# Patient Record
Sex: Female | Born: 1995 | Race: Black or African American | Hispanic: No | Marital: Single | Smoking: Never smoker
Health system: Southern US, Community
[De-identification: ages and names within clinical notes are randomized; demographics above are authoritative.]

## PROBLEM LIST (undated history)

## (undated) DIAGNOSIS — B9689 Other specified bacterial agents as the cause of diseases classified elsewhere: Secondary | ICD-10-CM

## (undated) DIAGNOSIS — N760 Acute vaginitis: Secondary | ICD-10-CM

## (undated) DIAGNOSIS — G43909 Migraine, unspecified, not intractable, without status migrainosus: Secondary | ICD-10-CM

## (undated) HISTORY — DX: Migraine, unspecified, not intractable, without status migrainosus: G43.909

## (undated) HISTORY — PX: NO PAST SURGERIES: SHX2092

## (undated) HISTORY — PX: NASAL SINUS SURGERY: SHX719

---
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
## 2016-08-18 DIAGNOSIS — N6320 Unspecified lump in the left breast, unspecified quadrant: Secondary | ICD-10-CM | POA: Insufficient documentation

## 2017-03-20 ENCOUNTER — Emergency Department
Admission: EM | Admit: 2017-03-20 | Discharge: 2017-03-20 | Disposition: A | Discharge status: Home / Self Care | Payer: Self-pay | Attending: Emergency Medicine | Admitting: Emergency Medicine

## 2017-03-20 ENCOUNTER — Other Ambulatory Visit: Payer: Self-pay

## 2017-03-20 DIAGNOSIS — R112 Nausea with vomiting, unspecified: Secondary | ICD-10-CM | POA: Insufficient documentation

## 2017-03-20 DIAGNOSIS — R1010 Upper abdominal pain, unspecified: Secondary | ICD-10-CM | POA: Insufficient documentation

## 2017-03-20 DIAGNOSIS — R197 Diarrhea, unspecified: Secondary | ICD-10-CM

## 2017-03-20 DIAGNOSIS — R1084 Generalized abdominal pain: Secondary | ICD-10-CM

## 2017-03-20 LAB — CBC
HEMATOCRIT: 40.3 % (ref 35.0–47.0)
Hemoglobin: 13.3 g/dL (ref 12.0–16.0)
MCH: 28.9 pg (ref 26.0–34.0)
MCHC: 33.1 g/dL (ref 32.0–36.0)
MCV: 87.3 fL (ref 80.0–100.0)
Platelets: 391 10*3/uL (ref 150–440)
RBC: 4.61 MIL/uL (ref 3.80–5.20)
RDW: 13.1 % (ref 11.5–14.5)
WBC: 7.9 10*3/uL (ref 3.6–11.0)

## 2017-03-20 LAB — URINALYSIS, COMPLETE (UACMP) WITH MICROSCOPIC
BACTERIA UA: NONE SEEN
BILIRUBIN URINE: NEGATIVE
Glucose, UA: NEGATIVE mg/dL
Hgb urine dipstick: NEGATIVE
Ketones, ur: NEGATIVE mg/dL
Leukocytes, UA: NEGATIVE
Nitrite: NEGATIVE
Protein, ur: NEGATIVE mg/dL
SPECIFIC GRAVITY, URINE: 1.023 (ref 1.005–1.030)
pH: 5 (ref 5.0–8.0)

## 2017-03-20 LAB — COMPREHENSIVE METABOLIC PANEL
ALBUMIN: 4.3 g/dL (ref 3.5–5.0)
ALT: 16 U/L (ref 14–54)
AST: 24 U/L (ref 15–41)
Alkaline Phosphatase: 44 U/L (ref 38–126)
Anion gap: 10 (ref 5–15)
BUN: 10 mg/dL (ref 6–20)
CO2: 24 mmol/L (ref 22–32)
CREATININE: 0.64 mg/dL (ref 0.44–1.00)
Calcium: 9.4 mg/dL (ref 8.9–10.3)
Chloride: 103 mmol/L (ref 101–111)
GFR calc Af Amer: >60 mL/min (ref >60)
GFR calc non Af Amer: >60 mL/min (ref >60)
GLUCOSE: 89 mg/dL (ref 65–99)
POTASSIUM: 3.5 mmol/L (ref 3.5–5.1)
Sodium: 137 mmol/L (ref 135–145)
Total Bilirubin: 0.6 mg/dL (ref 0.3–1.2)
Total Protein: 7.8 g/dL (ref 6.5–8.1)

## 2017-03-20 LAB — POCT PREGNANCY, URINE: PREG TEST UR: NEGATIVE

## 2017-03-20 LAB — LIPASE, BLOOD: LIPASE: 33 U/L (ref 11–51)

## 2017-03-20 MED ORDER — KETOROLAC TROMETHAMINE 60 MG/2ML IM SOLN
60.0000 mg | Freq: Once | INTRAMUSCULAR | Status: AC
  Administered 2017-03-20: 60 mg via INTRAMUSCULAR
  Filled 2017-03-20: qty 2

## 2017-03-20 MED ORDER — ONDANSETRON 4 MG PO TBDP: 4.0000 mg | ORAL_TABLET | Freq: Three times a day (TID) | ORAL | 0 refills | Status: DC | PRN

## 2017-03-20 MED ORDER — ONDANSETRON 4 MG PO TBDP
4.0000 mg | ORAL_TABLET | Freq: Once | ORAL | Status: AC
  Administered 2017-03-20: 4 mg via ORAL
  Filled 2017-03-20: qty 1

## 2017-03-20 NOTE — Discharge Instructions (Signed)
Please follow-up with the acute care clinic for further evaluation.  Please return with any other concerns or any worsening condition.

## 2017-03-20 NOTE — ED Provider Notes (Signed)
[facility] Emergency Department Provider Note   ____________________________________________   First MD Initiated Contact with Patient 03/20/17 [phone]     (approximate)  I have reviewed the triage vital signs and the nursing notes.   HISTORY  Chief Complaint Diarrhea and Emesis    HPI Vicki Hill is a 22 y.o. female who comes into the hospital today with some nausea vomiting and diarrhea.  The patient states that she did not go to work yesterday morning because she was feeling nauseous.  She developed some vomiting as well as some diarrhea.  She decided to come in last night because she vomited and had diarrhea all day long.  The patient states that she vomited about 4 times and had about 5 episodes of diarrhea.  The patient stated that she had some abdominal pain but it has come down it is not as bad as it was previously.  The patient states that she did have a sick contact she found out about yesterday.  The patient rates her pain a 5 out of 10 in intensity.  She reports that it is crampy pain.  The emesis was whenever she tried to eat and just watery.  The patient's diarrhea was brown.  She came into the hospital today for evaluation of her symptoms.  History reviewed. No pertinent past medical history.  There are no active problems to display for this patient.   History reviewed. No pertinent surgical history.  Prior to Admission medications   Medication Sig Start Date End Date Taking? Authorizing Provider  ondansetron (ZOFRAN ODT) 4 MG disintegrating tablet Take 1 tablet (4 mg total) by mouth every 8 (eight) hours as needed for nausea or vomiting. 03/20/17   Loney Hering, MD    Allergies Patient has no known allergies.  No family history on file.  Social History Social History   Tobacco Use  . Smoking status: Never Smoker  . Smokeless tobacco: Never Used  Substance Use Topics  . Alcohol use: No    Frequency: Never  . Drug use: No     Review of Systems  Constitutional: No fever/chills Eyes: No visual changes. ENT: No sore throat. Cardiovascular: Denies chest pain. Respiratory: Denies shortness of breath. Gastrointestinal:  abdominal pain.   nausea,  vomiting.   diarrhea.  No constipation. Genitourinary: Negative for dysuria. Musculoskeletal: Negative for back pain. Skin: Negative for rash. Neurological: Negative for headaches, focal weakness or numbness. No fevers  ____________________________________________   PHYSICAL EXAM:  VITAL SIGNS: ED Triage Vitals [03/20/17 0043]  Enc Vitals Group     BP 130/78     Pulse Rate 87     Resp 18     Temp 97.6 F (36.4 C)     Temp Source Oral     SpO2 100 %     Weight 185 lb (83.9 kg)     Height 5\' 4"  (1.626 m)     Head Circumference      Peak Flow      Pain Score 7     Pain Loc      Pain Edu?      Excl. in [place]?     Constitutional: Alert and oriented. Well appearing and in mild distress. Eyes: Conjunctivae are normal. PERRL. EOMI. Head: Atraumatic. Nose: No congestion/rhinnorhea. Mouth/Throat: Mucous membranes are moist.  Oropharynx non-erythematous. Cardiovascular: Normal rate, regular rhythm. Grossly normal heart sounds.  Good peripheral circulation. Respiratory: Normal respiratory effort.  No retractions. Lungs CTAB. Gastrointestinal: Soft with  some mild upper abdominal tenderness to palpation. No distention.  Positive bowel sounds Musculoskeletal: No lower extremity tenderness nor edema.   Neurologic:  Normal speech and language.  Skin:  Skin is warm, dry and intact.  Psychiatric: Mood and affect are normal.  Oriented well-appearing  ____________________________________________   LABS (all labs ordered are listed, but only abnormal results are displayed)  Labs Reviewed  URINALYSIS, COMPLETE (UACMP) WITH MICROSCOPIC - Abnormal; Notable for the following components:      Result Value   Color, Urine YELLOW (*)    APPearance CLEAR (*)     Squamous Epithelial / LPF 0-5 (*)    All other components within normal limits  LIPASE, BLOOD  COMPREHENSIVE METABOLIC PANEL  CBC  POC URINE PREG, ED  POCT PREGNANCY, URINE   ____________________________________________  EKG  None ____________________________________________  RADIOLOGY  ED MD interpretation: None  Official radiology report(s): No results found.  ____________________________________________   PROCEDURES  Procedure(s) performed: None  Procedures  Critical Care performed: No  ____________________________________________   INITIAL IMPRESSION / ASSESSMENT AND PLAN / ED COURSE  As part of my medical decision making, I reviewed the following data within the electronic MEDICAL RECORD NUMBER Notes from prior ED visits and [place] Controlled Substance Database   This is a 22 year old female who comes into the hospital today with nausea vomiting diarrhea and abdominal pain.  My differential diagnosis includes gastroenteritis, pancreatitis, appendicitis  I did check some blood work on the patient.  The patient had a CBC CMP and lipase which were done which was all negative.  The patient also had a urinalysis which was negative.  When I evaluated the patient she was calm and in no acute distress.  I did give her a shot of Toradol as well as Zofran and tried some oral hydration.  The patient states that she has not vomited since yesterday afternoon and her last episode of diarrhea was at 8 PM.  I do not feel that the patient needs any imaging studies at this time.  She has some very active bowel sounds at this time.  I will discharge the patient home with some Zofran for nausea and vomiting and to have her follow-up with her primary care physician.  The patient will be discharged home.      ____________________________________________   FINAL CLINICAL IMPRESSION(S) / ED DIAGNOSES  Final diagnoses:  Nausea vomiting and diarrhea  Generalized abdominal pain     ED  Discharge Orders        Ordered    ondansetron (ZOFRAN ODT) 4 MG disintegrating tablet  Every 8 hours PRN     03/20/17 0707       Note:  This document was prepared using Dragon voice recognition software and may include unintentional dictation errors.    Loney Hering, MD 03/20/17 [phone]

## 2017-03-20 NOTE — ED Triage Notes (Signed)
Pt arrives to ED via POV from home with c/o diarrhea, emesis, and abdominal cramping x2 days. Pt reports 4 episodes of emesis and "a lot" of diarrhea. Pt denies chest pain, no SHOB. Pt is A&O, in NAD; RR even, regular, and unlabored. Skin color/temp is WNL.

## 2017-03-20 NOTE — ED Notes (Signed)
Pt reports going to work yesterday and leaving and vomiting and diarrhea. Pt states she is able to tolerate fluid now but has diarrhea. Pt has hyperactive bowels per MD. No distress noted.

## 2017-03-20 NOTE — ED Notes (Signed)
Pt given meds and po fluid to tolerate. Pt asked how long she would be here. Pt states she needs to be gone by 0900. Told pt we would have her d/c by then. Pt asked that when the MD write her note it be for yesterday as well. Explained to pt that we are unable to back date work notes.

## 2017-06-30 ENCOUNTER — Ambulatory Visit
Admission: EM | Admit: 2017-06-30 | Discharge: 2017-06-30 | Disposition: A | Discharge status: Home / Self Care | Payer: Self-pay | Attending: Emergency Medicine | Admitting: Emergency Medicine

## 2017-06-30 ENCOUNTER — Other Ambulatory Visit: Payer: Self-pay

## 2017-06-30 DIAGNOSIS — N760 Acute vaginitis: Secondary | ICD-10-CM

## 2017-06-30 DIAGNOSIS — B9689 Other specified bacterial agents as the cause of diseases classified elsewhere: Secondary | ICD-10-CM

## 2017-06-30 LAB — WET PREP, GENITAL
Sperm: NONE SEEN
TRICH WET PREP: NONE SEEN
Yeast Wet Prep HPF POC: NONE SEEN

## 2017-06-30 MED ORDER — METRONIDAZOLE 500 MG PO TABS: 500.0000 mg | ORAL_TABLET | Freq: Two times a day (BID) | ORAL | 0 refills | Status: AC

## 2017-06-30 NOTE — ED Notes (Signed)
Pt given swab for self wet-prep.

## 2017-06-30 NOTE — ED Triage Notes (Signed)
Reports "possible yeast infection". Noticed on Monday for symptoms.  Has had one in past and feels similar.

## 2017-06-30 NOTE — Discharge Instructions (Addendum)
Here is a list of primary care providers who are taking new patients: ° °Dr. Deanna Jones, Dr. William Plonk °[address] °[address] °[place] [address] °[phone] ° °[facility] [place] °[address] [address]  °[place] [address]  °[phone] ° °[facility] °[address]  °[place], [place] [address] °[phone] ° °[facility] [place] °[address]  °[phone] °[place], [place] [address] ° °Here are clinics/ other resources who will see you if you do not have insurance. Some have certain criteria that you must meet. Call them and find out what they are: ° °[facility]: °[address] [place] [address] [place], [place] [address] °Phone: [phone] °Hours: First and Third Saturdays of each Month, 9 a.m. - 1 p.m. ° °[facility]: °[address], [place], [place] [address] °Phone: [phone] °Hours: °Tuesday, 4 p.m. - 8 p.m. °Thursday, 1 p.m. - 8 p.m. °Wednesday, 9 a.m. - Noon ° °[facility] °[address], [place], [place] [address] °Phone: [phone] °Pharmacy Phone Number: [phone] °Dental Phone Number: [phone] °ACA Insurance Help: [phone] ° °Dental Hours: °Monday - Thursday, 8 a.m. - 6 p.m. ° °[facility] °[address] [place], [place] [address] °Phone: [phone] °Pharmacy Phone Number: [phone] °ACA Insurance Help: [phone] ° °[facility] °[address] [place], [place] [address] °Phone: [phone] °Pharmacy Phone Number: [phone] °ACA Insurance Help: [phone] ° °[facility] °[address] [place], [place] [address] °Phone: [phone] °ACA Insurance Help: [phone]  ° °[facility] °[address] [place],  [address] °Phone: [phone] ° °Go to www.goodrx.com to look up your medications. This will give you a list of where you can find your prescriptions at the most affordable prices. Or ask the pharmacist what the cash price is,  or if they have any other discount programs available to help make your medication more affordable. This can be less expensive than what you would pay with insurance.   °

## 2017-06-30 NOTE — ED Provider Notes (Signed)
HPI  SUBJECTIVE:  Vicki Hill is a 22 y.o. female who presents with 6 days of thick, clumpy nonodorous white vaginal discharge and vulvar itching.  No odor, rash, abnormal vaginal bleeding.  No nausea, vomiting fevers.  No abdominal, back, pelvic pain.  She states that she switched to a new body wash and her symptoms started shortly thereafter.  No antibiotics recently.  No urinary complaints.  She is in a long-term monogamous relationship with a female who is asymptomatic, STDs are not a concern today.  No aggravating or alleviating factors.  She has not tried anything for this.  She states this is identical to previous yeast infections.  She also has a history of BV.  No history of diabetes, gonorrhea, chlamydia, HIV, HSV, syphilis, Trichomonas.  LMP: 5/26.  Denies the possibility of being pregnant.  PMD: Health department.  History reviewed. No pertinent past medical history.  History reviewed. No pertinent surgical history.  History reviewed. No pertinent family history.  Social History   Tobacco Use  . Smoking status: Never Smoker  . Smokeless tobacco: Never Used  Substance Use Topics  . Alcohol use: Yes    Frequency: Never    Comment: occ  . Drug use: No    No current facility-administered medications for this encounter.   Current Outpatient Medications:  .  metroNIDAZOLE (FLAGYL) 500 MG tablet, Take 1 tablet (500 mg total) by mouth 2 (two) times daily for 7 days., Disp: 14 tablet, Rfl: 0 .  ondansetron (ZOFRAN ODT) 4 MG disintegrating tablet, Take 1 tablet (4 mg total) by mouth every 8 (eight) hours as needed for nausea or vomiting., Disp: 20 tablet, Rfl: 0  No Known Allergies   ROS  As noted in HPI.   Physical Exam  BP 125/80 (BP Location: Right Arm)   Pulse 90   Temp 98.7 F (37.1 C) (Oral)   Resp 16   LMP 06/03/2017 (Exact Date)   SpO2 100%   Constitutional: Well developed, well nourished, no acute distress Eyes:  EOMI, conjunctiva normal  bilaterally HENT: Normocephalic, atraumatic,mucus membranes moist Respiratory: Normal inspiratory effort Cardiovascular: Normal rate GI: nondistended soft, nontender. No suprapubic tenderness, no right lower quadrant or left lower quadrant tenderness back: No CVA tenderness GU: Deferred skin: No rash, skin intact Musculoskeletal: no deformities Neurologic: Alert & oriented x 3, no focal neuro deficits Psychiatric: Speech and behavior appropriate   ED Course   Medications - No data to display  Orders Placed This Encounter  Procedures  . Wet prep, genital    Standing Status:   Standing    Number of Occurrences:   1    Results for orders placed or performed during the hospital encounter of 06/30/17 (from the past 24 hour(s))  Wet prep, genital     Status: Abnormal   Collection Time: 06/30/17  9:25 AM  Result Value Ref Range   Yeast Wet Prep HPF POC NONE SEEN NONE SEEN   Trich, Wet Prep NONE SEEN NONE SEEN   Clue Cells Wet Prep HPF POC PRESENT (A) NONE SEEN   WBC, Wet Prep HPF POC MODERATE (A) NONE SEEN   Sperm NONE SEEN    No results found.  ED Clinical Impression  BV (bacterial vaginosis)   ED Assessment/Plan  Wet prep positive for BV, negative for yeast or trichomonas.  Will send home with Flagyl.  Think that she is low risk for STDs, so testing for these months not done today.   Follow-up with PMD of  choice for the health department as needed.  Provide a primary care referral list.  Discussed labs, MDM, plan and followup with patient. Pt agrees with plan.   Meds ordered this encounter  Medications  . metroNIDAZOLE (FLAGYL) 500 MG tablet    Sig: Take 1 tablet (500 mg total) by mouth 2 (two) times daily for 7 days.    Dispense:  14 tablet    Refill:  0    *This clinic note was created using [employer]. Therefore, there may be occasional mistakes despite careful proofreading.  ?    Melynda Ripple, MD 06/30/17 [phone]

## 2017-07-31 DIAGNOSIS — A6004 Herpesviral vulvovaginitis: Secondary | ICD-10-CM | POA: Insufficient documentation

## 2018-07-16 DIAGNOSIS — N898 Other specified noninflammatory disorders of vagina: Secondary | ICD-10-CM | POA: Insufficient documentation

## 2018-07-30 ENCOUNTER — Other Ambulatory Visit: Payer: Self-pay

## 2018-07-30 DIAGNOSIS — Z20822 Contact with and (suspected) exposure to covid-19: Secondary | ICD-10-CM

## 2018-08-01 LAB — NOVEL CORONAVIRUS, NAA: SARS-CoV-2, NAA: NOT DETECTED

## 2018-09-26 ENCOUNTER — Ambulatory Visit: Payer: Self-pay | Admitting: Advanced Practice Midwife

## 2018-09-26 ENCOUNTER — Encounter: Payer: Self-pay | Admitting: Advanced Practice Midwife

## 2018-09-26 ENCOUNTER — Other Ambulatory Visit: Payer: Self-pay

## 2018-09-26 DIAGNOSIS — Z113 Encounter for screening for infections with a predominantly sexual mode of transmission: Secondary | ICD-10-CM

## 2018-09-26 DIAGNOSIS — B379 Candidiasis, unspecified: Secondary | ICD-10-CM

## 2018-09-26 LAB — WET PREP FOR TRICH, YEAST, CLUE
Clue Cell Exam: POSITIVE — AB
Trichomonas Exam: NEGATIVE

## 2018-09-26 MED ORDER — CLOTRIMAZOLE 1 % VA CREA: 1.0000 | TOPICAL_CREAM | Freq: Every day | VAGINAL | 0 refills | Status: AC

## 2018-09-26 NOTE — Progress Notes (Signed)
Wet mount reviewed, pt treated for yeast per standing order. Provider orders completed.

## 2018-09-26 NOTE — Addendum Note (Signed)
Addended by: Doy Mince on: 09/26/2018 02:23 PM   Modules accepted: Orders

## 2018-09-26 NOTE — Progress Notes (Signed)
    STI clinic/screening visit  Subjective:  Vicki Hill is a 23 y.o.SBF nonsmoker nullip female being seen today for an STI screening visit. The patient reports they do have symptoms.  Patient has the following medical conditions:  There are no active problems to display for this patient.    Chief Complaint  Patient presents with  . SEXUALLY TRANSMITTED DISEASE    no bloodwork    HPI  Patient reports increased white malodorous leukorrhea since 09/09/18.  Had a virtual STD visit with [facility] and they dx'd BV and gave antibiotics--last dose 3 days ago.  Sxs continuing See flowsheet for further details and programmatic requirements.    The following portions of the patient's history were reviewed and updated as appropriate: allergies, current medications, past medical history, past social history, past surgical history and problem list.  Objective:  There were no vitals filed for this visit.  Physical Exam Constitutional:      Appearance: Normal appearance.  HENT:     Head: Normocephalic and atraumatic.     Nose: Nose normal.     Mouth/Throat:     Mouth: Mucous membranes are moist.  Eyes:     Conjunctiva/sclera: Conjunctivae normal.  Neck:     Musculoskeletal: Neck supple.  Pulmonary:     Effort: Pulmonary effort is normal.     Breath sounds: Normal breath sounds.  Abdominal:     Palpations: Abdomen is soft. There is no mass.     Tenderness: There is no abdominal tenderness. There is no guarding.     Comments: Soft, poor tone, without tenderness  Genitourinary:    General: Normal vulva.     Exam position: Lithotomy position.     Labia:        Right: No lesion.        Left: No lesion.      Vagina: Vaginal discharge (increased white leukorrhea, ph>4.5) present.     Cervix: Normal.     Uterus: Normal.      Adnexa: Right adnexa normal and left adnexa normal.     Rectum: Normal.  Lymphadenopathy:     Lower Body: No right inguinal adenopathy. No left  inguinal adenopathy.  Skin:    General: Skin is warm and dry.  Neurological:     Mental Status: She is alert.  Psychiatric:        Mood and Affect: Mood normal.        Behavior: Behavior normal.       Assessment and Plan:  Vicki Hill is a 23 y.o. female presenting to the [facility] Department for STI screening  1. Screening examination for venereal disease Treat wet mount per standing orders Immunization nurse consult - WET PREP FOR [place], YEAST, [place] [facility]     Return if symptoms worsen or fail to improve.  No future appointments.  Herbie Saxon, CNM

## 2018-10-08 ENCOUNTER — Telehealth: Payer: Self-pay | Admitting: Family Medicine

## 2018-10-08 NOTE — Telephone Encounter (Signed)
TC to patient.  Reports still has vaginal d/c and she finished her medication from last visit.  Verified ID via password and discussed negative GC/Chlamydia. Requests an appt. Scheduled for tomorrow afternoon. Aileen Fass, RN

## 2018-10-09 ENCOUNTER — Encounter: Payer: Self-pay | Admitting: Advanced Practice Midwife

## 2018-10-09 ENCOUNTER — Ambulatory Visit: Payer: Self-pay | Admitting: Advanced Practice Midwife

## 2018-10-09 ENCOUNTER — Other Ambulatory Visit: Payer: Self-pay

## 2018-10-09 DIAGNOSIS — B9689 Other specified bacterial agents as the cause of diseases classified elsewhere: Secondary | ICD-10-CM

## 2018-10-09 DIAGNOSIS — N760 Acute vaginitis: Secondary | ICD-10-CM

## 2018-10-09 DIAGNOSIS — Z113 Encounter for screening for infections with a predominantly sexual mode of transmission: Secondary | ICD-10-CM

## 2018-10-09 LAB — WET PREP FOR TRICH, YEAST, CLUE
Trichomonas Exam: NEGATIVE
Yeast Exam: NEGATIVE

## 2018-10-09 MED ORDER — METRONIDAZOLE 500 MG PO TABS: 500.0000 mg | ORAL_TABLET | Freq: Two times a day (BID) | ORAL | 0 refills | Status: AC

## 2018-10-09 NOTE — Progress Notes (Signed)
    STI clinic/screening visit  Subjective:  Vicki Hill is a 23 y.o.nonsmoker nullip female being seen today for an STI screening visit. The patient reports they do have symptoms.  Patient has the following medical conditions:  There are no active problems to display for this patient.    Chief Complaint  Patient presents with  . Exposure to STD    HPI  Patient reports clear-white discharge with "yeasty" smell since 09/25/18.  GC/CHlamydia from 09/26/18 neg and had yeast dx then treated with Clotrimazole cream x 7 days without results.  On ocp's  See flowsheet for further details and programmatic requirements.    The following portions of the patient's history were reviewed and updated as appropriate: allergies, current medications, past medical history, past social history, past surgical history and problem list.  Objective:  There were no vitals filed for this visit.  Physical Exam Constitutional:      Appearance: Normal appearance.  HENT:     Head: Normocephalic and atraumatic.     Nose: Nose normal.     Mouth/Throat:     Mouth: Mucous membranes are moist.  Eyes:     Conjunctiva/sclera: Conjunctivae normal.  Neck:     Musculoskeletal: Normal range of motion and neck supple.  Pulmonary:     Effort: Pulmonary effort is normal.     Breath sounds: Normal breath sounds.  Abdominal:     Palpations: Abdomen is soft.     Comments: Soft without tenderness  Genitourinary:    General: Normal vulva.     Exam position: Lithotomy position.     Pubic Area: No rash or pubic lice.      Labia:        Right: No rash or lesion.        Left: No rash or lesion.      Vagina: Vaginal discharge (white creamy ph<4.5) present.     Cervix: Normal.     Uterus: Normal.      Adnexa: Right adnexa normal and left adnexa normal.     Rectum: Normal.  Skin:    General: Skin is warm and dry.  Neurological:     Mental Status: She is alert.  Psychiatric:        Mood and Affect: Mood  normal.       Assessment and Plan:  Vicki Hill is a 23 y.o. female presenting to the [facility] for STI screening  1. Screening examination for venereal disease Treat wet mount per standing orders Immunization nurse consult - WET PREP FOR [place]     Return if symptoms worsen or fail to improve.  No future appointments.  Herbie Saxon, CNM

## 2018-10-09 NOTE — Addendum Note (Signed)
Addended by: Jenetta Downer on: 10/09/2018 03:22 PM   Modules accepted: Orders

## 2018-10-09 NOTE — Addendum Note (Signed)
Addended by: Jenetta Downer on: 10/09/2018 03:31 PM   Modules accepted: Orders

## 2018-10-09 NOTE — Progress Notes (Signed)
Patient here for STD testing. Patient was here about 2 weeks ago and states she was treated for yeast. States she used all 7 nights worth of cream, but is still having symptoms. GC/Chlam. From 09/26/2018 were negative.Jenetta Downer, RN

## 2018-10-09 NOTE — Progress Notes (Signed)
Wet mount results reviewed and shown to provider, patient treated for BV per SO.Marland KitchenJenetta Downer, RN

## 2018-10-28 ENCOUNTER — Ambulatory Visit
Admission: EM | Admit: 2018-10-28 | Discharge: 2018-10-28 | Disposition: A | Discharge status: Home / Self Care | Payer: Self-pay | Attending: Family Medicine | Admitting: Family Medicine

## 2018-10-28 ENCOUNTER — Other Ambulatory Visit: Payer: Self-pay

## 2018-10-28 DIAGNOSIS — B9689 Other specified bacterial agents as the cause of diseases classified elsewhere: Secondary | ICD-10-CM

## 2018-10-28 DIAGNOSIS — N898 Other specified noninflammatory disorders of vagina: Secondary | ICD-10-CM

## 2018-10-28 DIAGNOSIS — N760 Acute vaginitis: Secondary | ICD-10-CM

## 2018-10-28 HISTORY — DX: Other specified bacterial agents as the cause of diseases classified elsewhere: N76.0

## 2018-10-28 HISTORY — DX: Other specified bacterial agents as the cause of diseases classified elsewhere: B96.89

## 2018-10-28 HISTORY — DX: Acute vaginitis: B96.89

## 2018-10-28 LAB — WET PREP, GENITAL
Sperm: NONE SEEN
Trich, Wet Prep: NONE SEEN
Yeast Wet Prep HPF POC: NONE SEEN

## 2018-10-28 MED ORDER — TINIDAZOLE 500 MG PO TABS: 2.0000 g | ORAL_TABLET | Freq: Every day | ORAL | 0 refills | Status: AC

## 2018-10-28 MED ORDER — FLUCONAZOLE 150 MG PO TABS: ORAL_TABLET | ORAL | 0 refills | Status: DC

## 2018-10-28 NOTE — Discharge Instructions (Signed)
It was very nice seeing you today in clinic. Thank you for entrusting me with your care.   Take medications as prescribed.   Make arrangements to follow up with your regular doctor in 1 week for re-evaluation if not improving. If your symptoms/condition worsens, please seek follow up care either here or in the ER. Please remember, our [facility] providers are "right here with you" when you need [place].   Again, it was my pleasure to take care of you today. Thank you for choosing our clinic. I hope that you start to feel better quickly.   Honor Loh, MSN, APRN, FNP-C, CEN Advanced Practice Provider [facility]

## 2018-10-28 NOTE — ED Triage Notes (Signed)
Pt reports she is having white foul smelling vaginal discharge since beginning of September. States her last sexual encounter in August. Went to health department and has been seen twice before at Urgent care. Had negative STD testing done. Has been treated for both yeast and BV and doesn't feel its improved.

## 2018-10-29 ENCOUNTER — Encounter: Payer: Self-pay | Admitting: Urgent Care

## 2018-10-29 NOTE — ED Provider Notes (Signed)
[place], [place]   Name: Vicki Hill DOB: [birth year] MRN: [id number] CSN: [id number] PCP: Patient, No Pcp Per  Arrival date and time:  10/28/18 1620  Chief Complaint:  Vaginal Discharge   NOTE: Prior to seeing the patient today, I have reviewed the triage nursing documentation and vital signs. Clinical staff has updated patient's PMH/PSHx, current medication list, and drug allergies/intolerances to ensure comprehensive history available to assist in medical decision making.   History:   HPI: Vicki Hill is a 23 y.o. female who presents today with complaints of vaginal discharge that began approximately around the first week in September. Patient was treated in September by the [facility] . STI testing was negative. She was found to have BV, and was treated with a course of oral metronidazole. When her symptoms did not resolve, she went to the [facility] Department where she was found to have vulvovaginal candidiasis. She was treated with topical medication, which she notes did not clear her symptoms. Patient presented for treatment a third time to the [facility] Department where she was found to have recurrent BV that was again treated with oral metronidazole. Patient presents today frustrated because her symptoms continue despite treatment.   Discharge is reported to be white in color. Patient describes the discharge as being malodorous. She denies any associated vaginal/pelvic pain. She has not appreciated any bleeding. Patient has not experienced any concurrent urinary symptoms; no dysuria, frequency, urgency, or gross hematuria. Patient denies any associated nausea, vomiting, fever/chills, or pain in her lower back, flank area, or abdomen. 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 She denies any vaginal pain, bleeding, or discharge. Patient's last menstrual period was  10/15/2018. There are no concerns that she is currently pregnant. She is not concerned about STI, as she has been tested since her last sexual encounter, which was in August.   Past Medical History:  Diagnosis Date   BV (bacterial vaginosis)    Recurrent    History reviewed. No pertinent surgical history.  History reviewed. No pertinent family history.  Social History   Tobacco Use   Smoking status: Never Smoker   Smokeless tobacco: Never Used  Substance Use Topics   Alcohol use: Yes    Frequency: Never    Comment: occ   Drug use: No    There are no active problems to display for this patient.   Home Medications:    No outpatient medications have been marked as taking for the 10/28/18 encounter [facility] Encounter).    Allergies:   Patient has no known allergies.  Review of Systems (ROS): Review of Systems  Constitutional: Negative for chills and fever.  Respiratory: Negative for cough and shortness of breath.   Cardiovascular: Negative for chest pain and palpitations.  Gastrointestinal: Negative for abdominal pain, nausea and vomiting.  Genitourinary: Positive for vaginal discharge. Negative for decreased urine volume, dysuria, frequency, hematuria, pelvic pain, urgency, vaginal bleeding and vaginal pain.       (+) vaginal itching  Musculoskeletal: Negative for back pain.  Skin: Negative for color change, pallor and rash.  Neurological: Negative for dizziness, syncope, weakness and headaches.  All other systems reviewed and are negative.    Vital Signs: Today's Vitals   10/28/18 1637 10/28/18 1638 10/28/18 1730  BP: 117/85    Pulse: 89    Resp: 18    Temp: 98.2 F (36.8 C)  TempSrc: Oral    SpO2: 100%    Weight:  201 lb (91.2 kg)   Height:  5\' 4"  (1.626 m)   PainSc: 0-No pain  0-No pain    Physical Exam: Physical Exam  Constitutional: She is oriented to person, place, and time and well-developed, well-nourished, and in no distress.  HENT:    Head: Normocephalic and atraumatic.  Mouth/Throat: Mucous membranes are normal.  Eyes: Pupils are equal, round, and reactive to light. EOM are normal.  Neck: Normal range of motion. Neck supple.  Cardiovascular: Normal rate, regular rhythm, normal heart sounds and intact distal pulses. Exam reveals no gallop and no friction rub.  No murmur heard. Pulmonary/Chest: Effort normal and breath sounds normal. No respiratory distress. She has no wheezes. She has no rales.  Abdominal: Soft. Normal appearance. There is no hepatosplenomegaly. There is no abdominal tenderness. There is no CVA tenderness.  Genitourinary:    Genitourinary Comments: Exam deferred. No vaginal/pelvic pain or bleeding. Patient is not currently pregnant. She has elected to self collect specimen swab for wet prep.   Neurological: She is alert and oriented to person, place, and time. Gait normal.  Skin: Skin is warm and dry. No rash noted.  Psychiatric: Mood, memory, affect and judgment normal.  Nursing note and vitals reviewed.   Urgent Care Treatments / Results:   LABS: PLEASE NOTE: all labs that were ordered this encounter are listed, however only abnormal results are displayed. Labs Reviewed  WET PREP, GENITAL - Abnormal; Notable for the following components:      Result Value   Clue Cells Wet Prep HPF POC PRESENT (*)    WBC, Wet Prep HPF POC MODERATE (*)    All other components within normal limits    EKG: -None  RADIOLOGY: No results found.  PROCEDURES: Procedures  MEDICATIONS RECEIVED THIS VISIT: Medications - No data to display  PERTINENT CLINICAL COURSE NOTES/UPDATES:   Initial Impression / Assessment and Plan / Urgent Care Course:  Pertinent labs & imaging results that were available during my care of the patient were personally reviewed by me and considered in my medical decision making (see lab/imaging section of note for values and interpretations).  Vicki Hill is a 23 y.o. female who  presents to [facility] today with complaints of Vaginal Discharge   Patient is well appearing overall in clinic today. She does not appear to be in any acute distress. Presenting symptoms (see HPI) and exam as documented above. Wet prep (+) for clue cells, which is consistent with bacterial vaginosis (BV) infection. She has failed 2 courses of oral metronidazole. Discussed change in treatment regimen to another medication; side effects discussed. Will treat with a 2 day course of oral tinidazole. Patient encouraged to complete the entire course of antibiotics even if she begins to feel better. Educated on need to avoid all ETOH while she is taking this medication in order to prevent a disulfiram like reaction that will result in significant nausea and vomiting. Patient encouraged to increase her fluid intake as much as possible. Discussed that water is always best to flush the urinary tract and prevent development of a urinary tract infection while on treatment for the BV. Additionally, patient complains of significant vaginal itching and feels like the topical from the health department was ineffective. Patient has has a history of vulvovaginal candidiasis in the past while on oral antimicrobial therapy. Will send in prophylactic fluconazole dose (150 mg x 1 - may repeat in 72  hours if still symptomatic).  Discussed follow up with primary care physician in 1 week for re-evaluation. I have reviewed the follow up and strict return precautions for any new or worsening symptoms. Patient is aware of symptoms that would be deemed urgent/emergent, and would thus require further evaluation either here or in the emergency department. At the time of discharge, she verbalized understanding and consent with the discharge plan as it was reviewed with her. All questions were fielded by provider and/or clinic staff prior to patient discharge.    Final Clinical Impressions / Urgent Care Diagnoses:   Final  diagnoses:  BV (bacterial vaginosis)  Vaginal itching    New Prescriptions:  [place] Controlled Substance Registry consulted? Not Applicable  Meds ordered this encounter  Medications   tinidazole (TINDAMAX) 500 MG tablet    Sig: Take 4 tablets (2,000 mg total) by mouth daily with breakfast for 2 doses.    Dispense:  8 tablet    Refill:  0   fluconazole (DIFLUCAN) 150 MG tablet    Sig: Take 1 tablet (150 mg) PO x 1 dose. May repeat 150 mg dose in 3 days if still symptomatic.    Dispense:  2 tablet    Refill:  0    Recommended Follow up Care:  Patient encouraged to follow up with the following provider within the specified time frame, or sooner as dictated by the severity of her symptoms. As always, she was instructed that for any urgent/emergent care needs, she should seek care either here or in the emergency department for more immediate evaluation.  Follow-up Information    PCP In 1 week.   Why: General reassessment of symptoms if not improving        NOTE: This note was prepared using Lobbyist along with smaller Company secretary. Despite my best ability to proofread, there is the potential that transcriptional errors may still occur from this process, and are completely unintentional.    Karen Kitchens, NP 10/29/18 1517

## 2018-10-31 ENCOUNTER — Telehealth: Payer: Self-pay | Admitting: Emergency Medicine

## 2018-10-31 MED ORDER — FLUCONAZOLE 150 MG PO TABS: 150.0000 mg | ORAL_TABLET | Freq: Every day | ORAL | 0 refills | Status: AC

## 2018-10-31 MED ORDER — CLINDAMYCIN HCL 300 MG PO CAPS: 300.0000 mg | ORAL_CAPSULE | Freq: Two times a day (BID) | ORAL | 0 refills | Status: AC

## 2018-10-31 NOTE — Telephone Encounter (Signed)
Patient called in today stating that her symptoms of BV are still there even after taking medications prescribed. Spoke with Honor Loh, FNP-C and he advised to send in Clindamycin 300 mg twice daily x 7 days and Diflucan 150mg  1 tablet to take after finishing Clindamycin. Again, stressed importance of establishing with PCP or GYN. Patient agreed and voiced understanding. Prescriptions sent to [facility].

## 2018-11-06 ENCOUNTER — Ambulatory Visit
Admission: EM | Admit: 2018-11-06 | Discharge: 2018-11-06 | Disposition: A | Discharge status: Home / Self Care | Payer: Self-pay | Attending: Family Medicine | Admitting: Family Medicine

## 2018-11-06 ENCOUNTER — Encounter: Payer: Self-pay | Admitting: Emergency Medicine

## 2018-11-06 ENCOUNTER — Other Ambulatory Visit: Payer: Self-pay

## 2018-11-06 DIAGNOSIS — M545 Low back pain: Secondary | ICD-10-CM

## 2018-11-06 DIAGNOSIS — N644 Mastodynia: Secondary | ICD-10-CM

## 2018-11-06 DIAGNOSIS — Z3202 Encounter for pregnancy test, result negative: Secondary | ICD-10-CM

## 2018-11-06 LAB — HCG, QUANTITATIVE, PREGNANCY: hCG, Beta Chain, Quant, S: <1 m[IU]/mL (ref <5)

## 2018-11-06 LAB — PREGNANCY, URINE: Preg Test, Ur: NEGATIVE

## 2018-11-06 NOTE — ED Triage Notes (Signed)
Patient here for pregnancy test. She states she had a period on 10/06 but she says her period was late for August. She states her breasts are sore and her back hurts.

## 2018-11-06 NOTE — ED Provider Notes (Signed)
[facility] ____________________________________________  Time seen: Approximately 5:22 PM  I have reviewed the triage vital signs and the nursing notes.   HISTORY  Chief Complaint Possible Pregnancy  HPI Vicki Hill is a 23 y.o. female presenting for evaluation of pregnancy test.  Patient reports concern of pregnancy as she has been having some breast tenderness and lower back discomfort present for the last 1 week.  States her back discomfort is improved with stretching, and is not completely uncommon with her she sits throughout most of the day.  Denies any fall or direct injury.  States bilateral breast tenderness without skin changes. States next menstrual should be soon, and occasionally has breast tenderness with menstrual.  No injury or trauma.  Denies cough, ingestion, sore throat, fevers, vomiting, diarrhea, chest pain or shortness of breath, changes in taste or smell or direct known sick contacts.  States she gets screened daily at work for fever with periodic Covid testing which has thus far been negative.  Patient reports she had a normal menstrual cycle at the beginning of October, but reports her menstrual cycle is normally at the end of August, but states it moved to the beginning of September.  Last sexual intercourse over a month ago.  Expresses concern of pregnancy and wanted evaluated.  Took home pregnancy test that thus far been negative. Denies dysuria, vaginal discharge or vaginal complaints.   Reports otherwise doing well denies other complaints.  Patient's last menstrual period was 10/15/2018.   Past Medical History:  Diagnosis Date  . BV (bacterial vaginosis)    Recurrent    There are no active problems to display for this patient.   History reviewed. No pertinent surgical history.   No current facility-administered medications for this encounter.   Current Outpatient Medications:  .  clindamycin (CLEOCIN) 300 MG capsule, Take 1 capsule  (300 mg total) by mouth 2 (two) times daily for 7 days., Disp: 14 capsule, Rfl: 0  Allergies Patient has no known allergies.  History reviewed. No pertinent family history.  Social History Social History   Tobacco Use  . Smoking status: Never Smoker  . Smokeless tobacco: Never Used  Substance Use Topics  . Alcohol use: Yes    Frequency: Never    Comment: occ  . Drug use: No    Review of Systems Constitutional: No fever ENT: No sore throat. Cardiovascular: Denies chest pain. Respiratory: Denies shortness of breath. Gastrointestinal: No abdominal pain.  No nausea, no vomiting.  No diarrhea.   Genitourinary: Negative for dysuria. Musculoskeletal: Negative for back pain. Skin: Negative for rash.   ____________________________________________   PHYSICAL EXAM:  VITAL SIGNS: ED Triage Vitals  Enc Vitals Group     BP 11/06/18 1634 117/77     Pulse Rate 11/06/18 1634 (!) 103     Resp 11/06/18 1634 18     Temp 11/06/18 1634 99.1 F (37.3 C)     Temp Source 11/06/18 1634 Oral     SpO2 11/06/18 1634 100 %     Weight 11/06/18 1633 205 lb (93 kg)     Height 11/06/18 1633 5\' 4"  (1.626 m)     Head Circumference --      Peak Flow --      Pain Score 11/06/18 1633 6     Pain Loc --      Pain Edu? --      Excl. in [place]? --     Constitutional: Alert and oriented. Well appearing and in no  acute distress. Eyes: Conjunctivae are normal.  ENT      Head: Normocephalic and atraumatic. Cardiovascular: Normal rate, regular rhythm. Grossly normal heart sounds.  Good peripheral circulation. Respiratory: Normal respiratory effort without tachypnea nor retractions. Breath sounds are clear and equal bilaterally. No wheezes, rales, rhonchi. Gastrointestinal: Soft and nontender. No CVA tenderness. Musculoskeletal:  No midline cervical, thoracic or lumbar tenderness to palpation.  Neurologic:  Normal speech and language.  Skin:  Skin is warm, dry and intact. No rash noted. Psychiatric:  Mood and affect are normal. Speech and behavior are normal. Patient exhibits appropriate insight and judgment   ___________________________________________   LABS (all labs ordered are listed, but only abnormal results are displayed)  Labs Reviewed  PREGNANCY, URINE  HCG, QUANTITATIVE, PREGNANCY   ____________________________________________   PROCEDURES Procedures    INITIAL IMPRESSION / ASSESSMENT AND PLAN / ED COURSE  Pertinent labs & imaging results that were available during my care of the patient were reviewed by me and considered in my medical decision making (see chart for details).  Well-appearing patient.  No acute distress.  Urine pregnancy test negative.  Will evaluate serum hCG.  Suspect muscular strain encourage stretching, supportive care.  Patient will be called with hCG test result.  Serum hCG negative, patient called and informed.  Encourage supportive care.  Discussed follow up with Primary care physician this week as needed. Discussed follow up and return parameters including no resolution or any worsening concerns. Patient verbalized understanding and agreed to plan.   ____________________________________________   FINAL CLINICAL IMPRESSION(S) / ED DIAGNOSES  Final diagnoses:  Pregnancy examination or test, negative result     ED Discharge Orders    None       Note: This dictation was prepared with Dragon dictation along with smaller phrase technology. Any transcriptional errors that result from this process are unintentional.         Marylene Land, NP 11/06/18 1752

## 2019-02-17 ENCOUNTER — Other Ambulatory Visit: Payer: Self-pay

## 2019-02-17 ENCOUNTER — Ambulatory Visit
Admission: EM | Admit: 2019-02-17 | Discharge: 2019-02-17 | Disposition: A | Discharge status: Home / Self Care | Payer: Self-pay | Attending: Emergency Medicine | Admitting: Emergency Medicine

## 2019-02-17 DIAGNOSIS — Z3202 Encounter for pregnancy test, result negative: Secondary | ICD-10-CM | POA: Insufficient documentation

## 2019-02-17 LAB — HCG, QUANTITATIVE, PREGNANCY: hCG, Beta Chain, Quant, S: <1 m[IU]/mL (ref <5)

## 2019-02-17 NOTE — ED Provider Notes (Signed)
HPI  SUBJECTIVE:  Vicki Hill is a G0P0 24 y.o. female who presents with breast tenderness, back pain since 1/13.  States that this normally occurs with menses but states it is worse than usual.  States that menses started yesterday.  They were bright red, but have normalized today.  She reports mild nausea.  No vomiting, abdominal pain.  No other vaginal complaints.  States that she had unprotected sex on 1/12 and took a Plan B on 1/13.  She had light vaginal bleeding several days later but it resolved.  She had 2 negative home pregnancy tests but would like to make sure that she is not pregnant.  There are no aggravating or alleviating factors.  She has not tried anything for symptoms.  She has a past medical history of BV, yeast.  No history of diabetes, hypertension, PID, gonorrhea chlamydia HIV syphilis herpes trichomonas.  LMP: Started yesterday.  PMD: None.    Past Medical History:  Diagnosis Date  . BV (bacterial vaginosis)    Recurrent    History reviewed. No pertinent surgical history.  History reviewed. No pertinent family history.  Social History   Tobacco Use  . Smoking status: Never Smoker  . Smokeless tobacco: Never Used  Substance Use Topics  . Alcohol use: Yes    Comment: occ  . Drug use: No    No current facility-administered medications for this encounter. No current outpatient medications on file.  No Known Allergies   ROS  As noted in HPI.   Physical Exam  BP 125/89 (BP Location: Right Leg)   Pulse 90   Temp 98.5 F (36.9 C) (Oral)   Resp 16   Ht 5\' 4"  (1.626 m)   Wt 91.6 kg   LMP 02/16/2019   SpO2 100%   BMI 34.67 kg/m   Constitutional: Well developed, well nourished, no acute distress Eyes:  EOMI, conjunctiva normal bilaterally HENT: Normocephalic, atraumatic,mucus membranes moist Respiratory: Normal inspiratory effort Cardiovascular: Normal rate GI: soft nontender nondistended no suprapubic right lower quadrant left lower  quadrant tenderness Back: No CVAT.  No paralumbar, L-spine tenderness.   Skin: No rash, skin intact Musculoskeletal: no deformities Neurologic: Alert & oriented x 3, no focal neuro deficits Psychiatric: Speech and behavior appropriate   ED Course   Medications - No data to display  Orders Placed This Encounter  Procedures  . hCG, quantitative, pregnancy    Standing Status:   Standing    Number of Occurrences:   1    Results for orders placed or performed during the hospital encounter of 02/17/19 (from the past 24 hour(s))  hCG, quantitative, pregnancy     Status: None   Collection Time: 02/17/19  5:43 PM  Result Value Ref Range   hCG, Beta Chain, Quant, S <1 <5 mIU/mL   No results found.  ED Clinical Impression  1. Pregnancy examination or test, negative result      ED Assessment/Plan  Sending beta quant.  If negative, suspect her symptoms are from menses.  Will provide primary care list for ongoing routine care.  We also had a discussion about various options of birth control and the importance of using condoms as birth control and also for protection from STDs.  Beta quant negative.  Tylenol/ibuprofen as needed.  Discussed labs, MDM, treatment plan, and plan for follow-up with patient. Discussed sn/sx that should prompt return to the ED. patient agrees with plan.   No orders of the defined types were placed in this  encounter.   *This clinic note was created using Dragon dictation software. Therefore, there may be occasional mistakes despite careful proofreading.   ?   Melynda Ripple, MD 02/17/19 1931

## 2019-02-17 NOTE — ED Triage Notes (Signed)
Pt report she had unprotected sex on Jan12 and took a plan B pill the next afternoon. Has been having breast soreness and backache since then and some irregular bleeding. Started menses yesterday. Took 2 pregnancy tests which were both negative so would like a blood test.

## 2019-02-17 NOTE — Discharge Instructions (Addendum)
Your blood pregnancy test was negative.  I suspect that your symptoms are coming from having your period.  You can take 600 mg of ibuprofen combined with 1000 mg of Tylenol 3-4 times a day as needed.  Follow-up with a primary care physician of your choice for ongoing care and to discuss further birth control options.  Remember to consistently use condoms for birth control and for protection from STDs.  Here is a list of primary care providers who are taking new patients:  Dr. Otilio Miu, Dr. Adline Potter [address] [place] [address] [place] [address]  [phone]  [facility] [address] [place], [place] [address] [phone]  [facility] [place]  [phone] [place], [place] [address]  Here are clinics/ other resources who will see you if you do not have insurance. Some have certain criteria that you must meet. Call them and find out what they are:  [facility]: [address], [place], [place] [address] Phone: [phone] Hours: First and Third Saturdays of each Month, 9 a.m. - 1 p.m.  [facility]: [address], [place], [place], [place] [address] Phone: [phone] Hours: Tuesday, 4 p.m. - 8 p.m. Thursday, 1 p.m. - 8 p.m. Wednesday, 9 a.m. - [facility] [address], [place], [place] [address] Phone: [phone] Pharmacy Phone Number: [phone] Dental Phone Number: [phone] [place] Help: [phone]  Dental Hours: Monday - Thursday, 8 a.m. - 6 p.m.  [facility] [address] [place], [place] [address] Phone: [phone] Pharmacy Phone Number: [phone] [facility] Insurance Help: [phone]  [facility] [place] [address] [place], [place] [address] Phone: [phone] Pharmacy Phone Number: [phone] [facility] Insurance Help: [phone]  [facility] [address] [place], [place] [address] Phone: [phone] [facility] Insurance Help: [phone]   [place] [place],  [address] Phone: [phone]  Go to www.goodrx.com to look up your medications. This will give you a list of where you can find your prescriptions at the most affordable prices. Or ask the pharmacist what the cash price is, or if they have any other discount programs available to help make your medication more affordable. This can be less expensive than what you would pay with insurance.

## 2019-04-12 ENCOUNTER — Ambulatory Visit: Payer: Self-pay

## 2019-04-25 ENCOUNTER — Encounter: Payer: Self-pay | Admitting: Advanced Practice Midwife

## 2019-04-25 ENCOUNTER — Telehealth: Payer: Self-pay

## 2019-04-25 NOTE — Telephone Encounter (Signed)
Phone call to pt. Pt counseled that [facility] received message through Epic system, pt may need to contact her most recent providers regarding certain medications. [facility] concerned that pt may not receive a message back from the appropriate entity since we received the message. After discussing needs with pt, pt confirmed she will need to contact another provider.

## 2019-05-14 ENCOUNTER — Encounter: Payer: Self-pay | Admitting: Advanced Practice Midwife

## 2019-06-08 ENCOUNTER — Ambulatory Visit
Admission: EM | Admit: 2019-06-08 | Discharge: 2019-06-08 | Disposition: A | Discharge status: Home / Self Care | Payer: Self-pay | Attending: Family Medicine | Admitting: Family Medicine

## 2019-06-08 ENCOUNTER — Other Ambulatory Visit: Payer: Self-pay

## 2019-06-08 ENCOUNTER — Encounter: Payer: Self-pay | Admitting: Emergency Medicine

## 2019-06-08 DIAGNOSIS — N898 Other specified noninflammatory disorders of vagina: Secondary | ICD-10-CM | POA: Insufficient documentation

## 2019-06-08 LAB — WET PREP, GENITAL
Clue Cells Wet Prep HPF POC: NONE SEEN
Sperm: NONE SEEN
Trich, Wet Prep: NONE SEEN
Yeast Wet Prep HPF POC: NONE SEEN

## 2019-06-08 MED ORDER — FLUCONAZOLE 150 MG PO TABS: ORAL_TABLET | ORAL | 0 refills | Status: DC

## 2019-06-08 MED ORDER — TINIDAZOLE 500 MG PO TABS: 2.0000 g | ORAL_TABLET | Freq: Every day | ORAL | 0 refills | Status: AC

## 2019-06-08 NOTE — Discharge Instructions (Addendum)
It was very nice seeing you today in clinic. Thank you for entrusting me with your care.  ° °Make arrangements to follow up with your regular doctor in 1 week for re-evaluation if not improving. If your symptoms/condition worsens, please seek follow up care either here or in the ER. Please remember, our [facility] providers are "right here with you" when you need us.  ° °Again, it was my pleasure to take care of you today. Thank you for choosing our clinic. I hope that you start to feel better quickly.  ° °Garlon Tuggle, MSN, APRN, FNP-C, CEN °Advanced Practice Provider °[facility] °

## 2019-06-08 NOTE — ED Triage Notes (Signed)
Patient c/o vaginal discharge and vaginal irritation that started 3 days ago.

## 2019-06-08 NOTE — ED Provider Notes (Signed)
[place]   Name: Vicki Hill DOB: 1995/12/14 MRN: [id number] CSN: [id number] PCP: Patient, No Pcp Per  Arrival date and time:  06/08/19 1236  Chief Complaint:  Vaginal Discharge  NOTE: Prior to seeing the patient today, I have reviewed the triage nursing documentation and vital signs. Clinical staff has updated patient's PMH/PSHx, current medication list, and drug allergies/intolerances to ensure comprehensive history available to assist in medical decision making.   History:   HPI: Vicki Hill is a 24 y.o. female who presents today with complaints of vaginal discharge that began approximately 3 days ago. Discharge is reported to be white in color. Patient describes the discharge as being malodorous even after bathing. She denies any associated vaginal/pelvic pain. Patient notes some external vaginal irritation. She has not appreciated any bleeding. Patient has not experienced any concurrent urinary symptoms; no dysuria, frequency, urgency, or gross hematuria. Patient denies any associated nausea, vomiting, fever/chills, or pain in her lower back, flank area, or abdomen. Patient advises that she does not have a significant history for STIs in the past. (PMH (+) for frequently recurring BV. She denies any vaginal pain or bleeding. Patient's last menstrual period was 05/25/2019 (approximate). There are no concerns that she is currently pregnant.  Past Medical History:  Diagnosis Date  . BV (bacterial vaginosis)    Recurrent    History reviewed. No pertinent surgical history.  History reviewed. No pertinent family history.  Social History   Tobacco Use  . Smoking status: Never Smoker  . Smokeless tobacco: Never Used  Substance Use Topics  . Alcohol use: Yes    Comment: occ  . Drug use: No    There are no problems to display for this patient.   Home Medications:    No outpatient medications have been marked as taking for the 06/08/19 encounter [facility]  Encounter).    Allergies:   Patient has no known allergies.  Review of Systems (ROS):  Review of systems NEGATIVE unless otherwise noted in narrative H&P section.   Vital Signs: Today's Vitals   06/08/19 1250 06/08/19 1253 06/08/19 1317  BP:  (!) 130/95   Pulse:  95   Resp:  14   Temp:  98.2 F (36.8 C)   TempSrc:  Oral   SpO2:  96%   Weight: 197 lb (89.4 kg)    Height: 5\' 4"  (1.626 m)    PainSc: 4   4     Physical Exam: Physical Exam  Constitutional: She is oriented to person, place, and time and well-developed, well-nourished, and in no distress.  HENT:  Head: Normocephalic and atraumatic.  Eyes: Pupils are equal, round, and reactive to light.  Cardiovascular: Normal rate and intact distal pulses.  Pulmonary/Chest: Effort normal.  Abdominal: Soft. Normal appearance and bowel sounds are normal. She exhibits no distension. There is no abdominal tenderness.  Genitourinary:    Genitourinary Comments: Exam deferred. No vaginal/pelvic pain or bleeding. Patient is not currently pregnant. She has elected to self collect DNA probe for GC (swab).    Neurological: She is alert and oriented to person, place, and time. Gait normal.  Skin: Skin is warm and dry. No rash noted. She is not diaphoretic.  Psychiatric: Mood, memory, affect and judgment normal.  Nursing note and vitals reviewed.   Urgent Care Treatments / Results:   Orders Placed This Encounter  Procedures  . Wet prep, genital    LABS: PLEASE NOTE: all labs that were ordered this encounter are listed, however  only abnormal results are displayed. Labs Reviewed  WET PREP, GENITAL - Abnormal; Notable for the following components:      Result Value   WBC, Wet Prep HPF POC FEW (*)    All other components within normal limits    EKG: -None  RADIOLOGY: No results found.  PROCEDURES: Procedures  MEDICATIONS RECEIVED THIS VISIT: Medications - No data to display  PERTINENT CLINICAL COURSE NOTES/UPDATES:     Initial Impression / Assessment and Plan / Urgent Care Course:  Pertinent labs & imaging results that were available during my care of the patient were personally reviewed by me and considered in my medical decision making (see lab/imaging section of note for values and interpretations).  Vicki Hill is a 24 y.o. female who presents to [facility] today with complaints of Vaginal Discharge  Patient is well appearing overall in clinic today. She does not appear to be in any acute distress. Presenting symptoms (see HPI) and exam as documented above. Wet prep swab did not reveal any candida, trichomonas, or clue cells. However, with that being said, patient frequently has BV and I suspect the lack of clue cells represents a false negative on the testing. Discussed findings and explained that it may be a relatively low bacterial burden present due to it being early in the course of her infection or there was an insufficient sample collected for processing. In any case, I am providing patient with a provisional Rx for use if condition worsens. Patient requesting 2 day tinidazole course rather that the 7 day metronidazole, which is certainly appropriate; Rx sent for 2 grams tinidazole daily x 2 days. Patient has has a history of vulvovaginal candidiasis in the past while on oral antimicrobial therapy. Will send in prophylactic fluconazole dose (150 mg x 1 - may repeat in 72 hours if still symptomatic) for patient to use should she develop symptoms.  Discussed follow up with primary care physician in 1 week for re-evaluation. I have reviewed the follow up and strict return precautions for any new or worsening symptoms. Patient is aware of symptoms that would be deemed urgent/emergent, and would thus require further evaluation either here or in the emergency department. At the time of discharge, she verbalized understanding and consent with the discharge plan as it was reviewed with her. All questions  were fielded by provider and/or clinic staff prior to patient discharge.    Final Clinical Impressions / Urgent Care Diagnoses:   Final diagnoses:  Vaginal discharge    New Prescriptions:  [place] Controlled Substance Registry consulted? Not Applicable  Meds ordered this encounter  Medications  . tinidazole (TINDAMAX) 500 MG tablet    Sig: Take 4 tablets (2,000 mg total) by mouth daily for 2 days.    Dispense:  8 tablet    Refill:  0  . fluconazole (DIFLUCAN) 150 MG tablet    Sig: Take 1 tablet (150 mg) PO x 1 dose. May repeat 150 mg dose in 3 days if still symptomatic.    Dispense:  2 tablet    Refill:  0    Recommended Follow up Care:  Patient encouraged to follow up with the following provider within the specified time frame, or sooner as dictated by the severity of her symptoms. As always, she was instructed that for any urgent/emergent care needs, she should seek care either here or in the emergency department for more immediate evaluation.  NOTE: This note was prepared using Dragon dictation software along with smaller  Company secretary. Despite my best ability to proofread, there is the potential that transcriptional errors may still occur from this process, and are completely unintentional.    Karen Kitchens, NP 06/08/19 1324

## 2019-06-27 ENCOUNTER — Ambulatory Visit: Payer: Self-pay

## 2019-07-17 ENCOUNTER — Encounter: Payer: Self-pay | Admitting: Emergency Medicine

## 2019-07-17 ENCOUNTER — Ambulatory Visit (INDEPENDENT_AMBULATORY_CARE_PROVIDER_SITE_OTHER): Payer: Self-pay

## 2019-07-17 ENCOUNTER — Other Ambulatory Visit: Payer: Self-pay

## 2019-07-17 ENCOUNTER — Ambulatory Visit
Admission: EM | Admit: 2019-07-17 | Discharge: 2019-07-17 | Disposition: A | Discharge status: Home / Self Care | Payer: Self-pay | Attending: Internal Medicine | Admitting: Internal Medicine

## 2019-07-17 DIAGNOSIS — S93402A Sprain of unspecified ligament of left ankle, initial encounter: Secondary | ICD-10-CM

## 2019-07-17 MED ORDER — IBUPROFEN 600 MG PO TABS: 600.0000 mg | ORAL_TABLET | Freq: Four times a day (QID) | ORAL | 0 refills | Status: DC | PRN

## 2019-07-17 NOTE — ED Provider Notes (Signed)
[facility]    CSN: [id number] Arrival date & time: 07/17/19  1731      History   Chief Complaint Chief Complaint  Patient presents with  . Ankle Pain    HPI Vicki Hill is a 24 y.o. female twisted her left ankle when she was walking with her family in a park 2 days ago.  Pain was manageable at the time of the injury.  She was able to bear some weight on that ankle.  Following that the pain worsened.  Pain is currently throbbing and sharp at times and is currently 5 out of 10 in severity.  Pain is aggravated by bearing weight and she is unable to bear weight fully on that ankle.  No bruising.  No numbness or tingling.  No known relieving factors.  She has tried just 1 dose of ibuprofen with no significant relief.  No radiation of pain.  She has some swelling around the left ankle.  HPI  Past Medical History:  Diagnosis Date  . BV (bacterial vaginosis)    Recurrent    There are no problems to display for this patient.   Past Surgical History:  Procedure Laterality Date  . NO PAST SURGERIES      OB History   No obstetric history on file.      Home Medications    Prior to Admission medications   Medication Sig Start Date End Date Taking? Authorizing Provider  fluconazole (DIFLUCAN) 150 MG tablet Take 1 tablet (150 mg) PO x 1 dose. May repeat 150 mg dose in 3 days if still symptomatic. 06/08/19   Karen Kitchens, NP  ibuprofen (ADVIL) 600 MG tablet Take 1 tablet (600 mg total) by mouth every 6 (six) hours as needed. 07/17/19   Aliana Kreischer, Myrene Galas, MD    Family History History reviewed. No pertinent family history.  Social History Social History   Tobacco Use  . Smoking status: Never Smoker  . Smokeless tobacco: Never Used  Vaping Use  . Vaping Use: Never used  Substance Use Topics  . Alcohol use: Yes    Comment: occ  . Drug use: No     Allergies   Patient has no known allergies.   Review of Systems Review of Systems  Respiratory:  Negative.   Gastrointestinal: Negative.   Genitourinary: Negative.   Musculoskeletal: Positive for arthralgias and joint swelling.  Skin: Negative.  Negative for color change and pallor.  Neurological: Negative.      Physical Exam Triage Vital Signs ED Triage Vitals  Enc Vitals Group     BP 07/17/19 1804 112/79     Pulse Rate 07/17/19 1804 77     Resp 07/17/19 1804 18     Temp 07/17/19 1804 99.1 F (37.3 C)     Temp Source 07/17/19 1804 Oral     SpO2 07/17/19 1804 97 %     Weight 07/17/19 1801 197 lb 1.5 oz (89.4 kg)     Height 07/17/19 1801 5\' 4"  (1.626 m)     Head Circumference --      Peak Flow --      Pain Score 07/17/19 1801 5     Pain Loc --      Pain Edu? --      Excl. in [place]? --    No data found.  Updated Vital Signs BP 112/79 (BP Location: Right Arm)   Pulse 77   Temp 99.1 F (37.3 C) (Oral)   Resp 18  Ht 5\' 4"  (1.626 m)   Wt 89.4 kg   LMP 06/21/2019 (Approximate)   SpO2 97%   BMI 33.83 kg/m   Visual Acuity Right Eye Distance:   Left Eye Distance:   Bilateral Distance:    Right Eye Near:   Left Eye Near:    Bilateral Near:     Physical Exam Vitals and nursing note reviewed.  Constitutional:      General: She is in acute distress.  Abdominal:     General: Bowel sounds are normal.     Palpations: Abdomen is soft.  Musculoskeletal:        General: Swelling and tenderness present. No deformity or signs of injury. Normal range of motion.  Skin:    General: Skin is warm.     Findings: No bruising or erythema.      UC Treatments / Results  Labs (all labs ordered are listed, but only abnormal results are displayed) Labs Reviewed - No data to display  EKG   Radiology No results found.  Procedures Procedures (including critical care time)  Medications Ordered in UC Medications - No data to display  Initial Impression / Assessment and Plan / UC Course  I have reviewed the triage vital signs and the nursing notes.  Pertinent labs &  imaging results that were available during my care of the patient were reviewed by me and considered in my medical decision making (see chart for details).     1.  Left ankle sprain: X-ray of the left ankle is negative for any acute fracture.  X-ray was independently interpreted by me. Ibuprofen 600 mg every 6 hours as needed for pain Icing of the left ankle, gentle range of motion of motion exercises If patient experiences worsening pain she is advised to return to urgent care to be reevaluated.  I expect her recovery over the next few days. Final Clinical Impressions(s) / UC Diagnoses   Final diagnoses:  Sprain of left ankle, unspecified ligament, initial encounter   Discharge Instructions   None    ED Prescriptions    Medication Sig Dispense Auth. Provider   ibuprofen (ADVIL) 600 MG tablet Take 1 tablet (600 mg total) by mouth every 6 (six) hours as needed. 30 tablet Joffrey Kerce, Myrene Galas, MD     PDMP not reviewed this encounter.   Chase Picket, MD 07/17/19 [phone]

## 2019-07-17 NOTE — ED Triage Notes (Signed)
Pt c/o left abkle pain. She states she turned her ankle over twice about 2 days ago. She has swelling in the area.

## 2019-10-16 ENCOUNTER — Ambulatory Visit: Payer: Self-pay

## 2019-10-23 ENCOUNTER — Other Ambulatory Visit: Payer: Self-pay

## 2019-10-23 ENCOUNTER — Ambulatory Visit
Admission: EM | Admit: 2019-10-23 | Discharge: 2019-10-23 | Disposition: A | Discharge status: Home / Self Care | Payer: BC Managed Care – PPO | Attending: Emergency Medicine | Admitting: Emergency Medicine

## 2019-10-23 DIAGNOSIS — B9689 Other specified bacterial agents as the cause of diseases classified elsewhere: Secondary | ICD-10-CM | POA: Insufficient documentation

## 2019-10-23 DIAGNOSIS — N760 Acute vaginitis: Secondary | ICD-10-CM

## 2019-10-23 LAB — PREGNANCY, URINE: Preg Test, Ur: NEGATIVE

## 2019-10-23 LAB — URINALYSIS, COMPLETE (UACMP) WITH MICROSCOPIC
Bilirubin Urine: NEGATIVE
Glucose, UA: NEGATIVE mg/dL
Leukocytes,Ua: NEGATIVE
Nitrite: NEGATIVE
Protein, ur: NEGATIVE mg/dL
Specific Gravity, Urine: >1.03 — ABNORMAL HIGH (ref 1.005–1.030)
pH: 5.5 (ref 5.0–8.0)

## 2019-10-23 LAB — WET PREP, GENITAL
Sperm: NONE SEEN
Trich, Wet Prep: NONE SEEN
Yeast Wet Prep HPF POC: NONE SEEN

## 2019-10-23 MED ORDER — METRONIDAZOLE 500 MG PO TABS: 500.0000 mg | ORAL_TABLET | Freq: Two times a day (BID) | ORAL | 0 refills | Status: DC

## 2019-10-23 NOTE — ED Triage Notes (Signed)
Patient states that she has been having vaginal discharge and she is a few days late for her cycle. States that she was treated for BV around the 20th.

## 2019-10-23 NOTE — ED Provider Notes (Signed)
[facility]    CSN: [id number] Arrival date & time: 10/23/19  1716      History   Chief Complaint Chief Complaint  Patient presents with  . Possible Pregnancy  . Vaginal Discharge    HPI Vicki Hill is a 24 y.o. female who presents with abnormal vaginal discharge with an odor x 7 days. She is not concerned about STD's LMP 10/9 but was a  Couple of days earlier and was lighter than her usual. She does not always use protection to prevent pregnancy.     Past Medical History:  Diagnosis Date  . BV (bacterial vaginosis)    Recurrent    There are no problems to display for this patient.   Past Surgical History:  Procedure Laterality Date  . NO PAST SURGERIES      OB History   No obstetric history on file.      Home Medications    Prior to Admission medications   Medication Sig Start Date End Date Taking? Authorizing Provider  metroNIDAZOLE (FLAGYL) 500 MG tablet Take 1 tablet (500 mg total) by mouth 2 (two) times daily. 10/23/19   Rodriguez-Southworth, Sunday Spillers, PA-C    Family History History reviewed. No pertinent family history.  Social History Social History   Tobacco Use  . Smoking status: Never Smoker  . Smokeless tobacco: Never Used  Vaping Use  . Vaping Use: Never used  Substance Use Topics  . Alcohol use: Yes    Comment: occ  . Drug use: No     Allergies   Patient has no known allergies.   Review of Systems Review of Systems  Constitutional: Negative for fever.  Gastrointestinal: Negative for abdominal pain.  Genitourinary: Positive for menstrual problem and vaginal discharge. Negative for dysuria, flank pain, frequency and urgency.     Physical Exam Triage Vital Signs ED Triage Vitals  Enc Vitals Group     BP 10/23/19 1741 115/71     Pulse Rate 10/23/19 1741 90     Resp 10/23/19 1741 16     Temp 10/23/19 1741 98.9 F (37.2 C)     Temp Source 10/23/19 1741 Oral     SpO2 10/23/19 1741 99 %     Weight  10/23/19 1739 203 lb (92.1 kg)     Height 10/23/19 1739 5\' 4"  (1.626 m)     Head Circumference --      Peak Flow --      Pain Score 10/23/19 1739 2     Pain Loc --      Pain Edu? --      Excl. in [place]? --    No data found.  Updated Vital Signs BP 115/71 (BP Location: Right Arm)   Pulse 90   Temp 98.9 F (37.2 C) (Oral)   Resp 16   Ht 5\' 4"  (1.626 m)   Wt 203 lb (92.1 kg)   LMP 09/21/2019   SpO2 99%   BMI 34.84 kg/m   Visual Acuity Right Eye Distance:   Left Eye Distance:   Bilateral Distance:    Right Eye Near:   Left Eye Near:    Bilateral Near:     Physical Exam Vitals and nursing note reviewed.  Constitutional:      General: She is not in acute distress.    Appearance: She is not toxic-appearing.  HENT:     Right Ear: External ear normal.     Left Ear: External ear normal.  Eyes:  General: No scleral icterus.    Conjunctiva/sclera: Conjunctivae normal.  Pulmonary:     Effort: Pulmonary effort is normal.  Musculoskeletal:        General: Normal range of motion.     Cervical back: Neck supple.  Neurological:     Mental Status: She is alert and oriented to person, place, and time.     Gait: Gait normal.  Psychiatric:        Mood and Affect: Mood normal.        Behavior: Behavior normal.        Thought Content: Thought content normal.        Judgment: Judgment normal.      UC Treatments / Results  Labs (all labs ordered are listed, but only abnormal results are displayed) Labs Reviewed  WET PREP, GENITAL - Abnormal; Notable for the following components:      Result Value   Clue Cells Wet Prep HPF POC PRESENT (*)    WBC, Wet Prep HPF POC FEW (*)    All other components within normal limits  URINALYSIS, COMPLETE (UACMP) WITH MICROSCOPIC - Abnormal; Notable for the following components:   APPearance HAZY (*)    Specific Gravity, Urine >1.030 (*)    Hgb urine dipstick TRACE (*)    Ketones, ur TRACE (*)    Bacteria, UA FEW (*)    All other  components within normal limits  PREGNANCY, URINE    EKG   Radiology No results found.  Procedures Procedures (including critical care time)  Medications Ordered in UC Medications - No data to display  Initial Impression / Assessment and Plan / UC Course  I have reviewed the triage vital signs and the nursing notes. Had BV and I placed her on Flagyl as noted.  Pertinent labs results that were available during my care of the patient were reviewed by me and considered in my medical decision making (see chart for details).   Final Clinical Impressions(s) / UC Diagnoses   Final diagnoses:  Bacterial vaginitis   Discharge Instructions   None    ED Prescriptions    Medication Sig Dispense Auth. Provider   metroNIDAZOLE (FLAGYL) 500 MG tablet Take 1 tablet (500 mg total) by mouth 2 (two) times daily. 14 tablet Rodriguez-Southworth, Sunday Spillers, PA-C     PDMP not reviewed this encounter.   Shelby Mattocks, PA-C 10/23/19 1825

## 2019-11-25 ENCOUNTER — Other Ambulatory Visit: Payer: Self-pay

## 2019-11-25 ENCOUNTER — Ambulatory Visit
Admission: EM | Admit: 2019-11-25 | Discharge: 2019-11-25 | Disposition: A | Discharge status: Home / Self Care | Payer: BC Managed Care – PPO | Attending: Emergency Medicine | Admitting: Emergency Medicine

## 2019-11-25 DIAGNOSIS — B349 Viral infection, unspecified: Secondary | ICD-10-CM | POA: Diagnosis not present

## 2019-11-25 DIAGNOSIS — Z20822 Contact with and (suspected) exposure to covid-19: Secondary | ICD-10-CM | POA: Insufficient documentation

## 2019-11-25 LAB — SARS CORONAVIRUS 2 (TAT 6-24 HRS): SARS Coronavirus 2: NEGATIVE

## 2019-11-25 MED ORDER — ONDANSETRON 8 MG PO TBDP: 8.0000 mg | ORAL_TABLET | Freq: Three times a day (TID) | ORAL | 0 refills | Status: DC | PRN

## 2019-11-25 NOTE — ED Provider Notes (Signed)
[facility]    CSN: [id number] Arrival date & time: 11/25/19  2549      History   Chief Complaint Chief Complaint  Patient presents with  . Generalized Body Aches    HPI Vicki Hill is a 24 y.o. female.   24 year old female here for evaluation of multiple complaints.  Patient reports that 2 days ago she developed some body aches, chills, sweats, shortness of breath, diarrhea, and a decrease in appetite.  She is also had some nasal congestion, ear pain, nausea, and lower abdominal pain.  Patient denies runny nose, sinus pressure, cough, wheezing, vomiting, urinary symptoms, changes to taste or smell, sick contacts, or blood in her stool.  Patient did complete her Covid vaccination in April.  Patient has not had her flu vaccine.     Past Medical History:  Diagnosis Date  . BV (bacterial vaginosis)    Recurrent    There are no problems to display for this patient.   Past Surgical History:  Procedure Laterality Date  . NO PAST SURGERIES      OB History   No obstetric history on file.      Home Medications    Prior to Admission medications   Medication Sig Start Date End Date Taking? Authorizing Provider  ondansetron (ZOFRAN ODT) 8 MG disintegrating tablet Take 1 tablet (8 mg total) by mouth every 8 (eight) hours as needed for nausea or vomiting. 11/25/19   Margarette Canada, NP    Family History History reviewed. No pertinent family history.  Social History Social History   Tobacco Use  . Smoking status: Never Smoker  . Smokeless tobacco: Never Used  Vaping Use  . Vaping Use: Never used  Substance Use Topics  . Alcohol use: Yes    Comment: occ  . Drug use: No     Allergies   Patient has no known allergies.   Review of Systems Review of Systems  Constitutional: Positive for chills, diaphoresis and fatigue. Negative for activity change, appetite change and fever.  HENT: Positive for congestion and ear pain. Negative for rhinorrhea,  sinus pressure, sinus pain and sore throat.   Respiratory: Positive for shortness of breath. Negative for cough and wheezing.   Cardiovascular: Negative for chest pain.  Gastrointestinal: Positive for abdominal pain, diarrhea and nausea. Negative for vomiting.  Genitourinary: Negative for dysuria, frequency and urgency.  Musculoskeletal: Positive for arthralgias and myalgias.  Skin: Negative for rash.  Neurological: Negative for syncope and headaches.  Hematological: Negative.   Psychiatric/Behavioral: Negative.      Physical Exam Triage Vital Signs ED Triage Vitals  Enc Vitals Group     BP 11/25/19 1124 122/84     Pulse Rate 11/25/19 1124 91     Resp 11/25/19 1124 18     Temp 11/25/19 1124 98.3 F (36.8 C)     Temp src --      SpO2 11/25/19 1124 99 %     Weight 11/25/19 1122 210 lb (95.3 kg)     Height 11/25/19 1122 '5\' 5"'  (1.651 m)     Head Circumference --      Peak Flow --      Pain Score 11/25/19 1122 8     Pain Loc --      Pain Edu? --      Excl. in [place]? --    No data found.  Updated Vital Signs BP 122/84 (BP Location: Left Arm)   Pulse 91   Temp 98.3 F (  36.8 C)   Resp 18   Ht '5\' 5"'  (1.651 m)   Wt 210 lb (95.3 kg)   LMP 11/17/2019   SpO2 99%   BMI 34.95 kg/m   Visual Acuity Right Eye Distance:   Left Eye Distance:   Bilateral Distance:    Right Eye Near:   Left Eye Near:    Bilateral Near:     Physical Exam Vitals and nursing note reviewed.  Constitutional:      General: She is not in acute distress.    Appearance: Normal appearance. She is normal weight. She is ill-appearing. She is not toxic-appearing.  HENT:     Head: Normocephalic and atraumatic.     Right Ear: Tympanic membrane, ear canal and external ear normal. There is no impacted cerumen.     Left Ear: Tympanic membrane, ear canal and external ear normal. There is no impacted cerumen.     Nose: Congestion and rhinorrhea present.     Comments: Nasal mucosa is erythematous and mildly  edematous with some bloody drainage in both naris.  Patient has no tenderness to frontal or maxillary sinuses to percussion.    Mouth/Throat:     Mouth: Mucous membranes are moist.     Pharynx: Oropharynx is clear. Posterior oropharyngeal erythema present. No oropharyngeal exudate.     Comments: Tonsillar pillars are unremarkable.  Posterior oropharynx reflects mild erythema and clear postnasal drip. Eyes:     General: No scleral icterus.    Extraocular Movements: Extraocular movements intact.     Conjunctiva/sclera: Conjunctivae normal.     Pupils: Pupils are equal, round, and reactive to light.  Cardiovascular:     Rate and Rhythm: Normal rate and regular rhythm.     Pulses: Normal pulses.     Heart sounds: Normal heart sounds. No murmur heard.  No gallop.   Pulmonary:     Effort: Pulmonary effort is normal.     Breath sounds: Normal breath sounds. No wheezing, rhonchi or rales.  Abdominal:     General: Abdomen is flat. Bowel sounds are normal. There is no distension.     Palpations: Abdomen is soft.     Tenderness: There is abdominal tenderness. There is no guarding or rebound.     Comments: Patient has generalized mild abdominal tenderness to palpation without focal findings.  Musculoskeletal:        General: No swelling or tenderness. Normal range of motion.     Cervical back: Normal range of motion and neck supple.  Lymphadenopathy:     Cervical: No cervical adenopathy.  Skin:    General: Skin is warm and dry.     Capillary Refill: Capillary refill takes less than 2 seconds.     Findings: No erythema or rash.  Neurological:     General: No focal deficit present.     Mental Status: She is alert and oriented to person, place, and time.  Psychiatric:        Mood and Affect: Mood normal.        Behavior: Behavior normal.        Thought Content: Thought content normal.        Judgment: Judgment normal.      UC Treatments / Results  Labs (all labs ordered are listed,  but only abnormal results are displayed) Labs Reviewed  SARS CORONAVIRUS 2 (TAT 6-24 HRS)    EKG   Radiology No results found.  Procedures Procedures (including critical care time)  Medications Ordered in UC  Medications - No data to display  Initial Impression / Assessment and Plan / UC Course  I have reviewed the triage vital signs and the nursing notes.  Pertinent labs & imaging results that were available during my care of the patient were reviewed by me and considered in my medical decision making (see chart for details).   Patient is here for variety of symptoms.  Patient's had shortness of breath, diarrhea, chills and sweats without original fever, and body aches x2 days.  She also reports a decreased appetite.  Patient has had some mild upper respiratory symptoms.  On exam nasal mucosa is mildly erythematous and edematous with some bloody discharge on the walls of both naris.  No sinus tenderness to percussion clear postnasal drip and erythema in posterior oropharynx.  Lung sounds are clear to auscultation abdomen has mild generalized tenderness but no focal findings.  Suspect patient has a viral process at play.  Will check Covid.  Will DC patient home with Zofran for nausea, instructions for nasal irrigation, and informational and supportive therapy.   Final Clinical Impressions(s) / UC Diagnoses   Final diagnoses:  Viral illness     Discharge Instructions     Isolate at home until the results of your Covid test are back.  If your Covid test is positive you will have to quarantine for 10 days from the start of your symptoms.  After 10 days you can break quarantine if your symptoms have improved and you have not had a fever for 24 hours.  If your Covid test is positive we will refer you to the infusion clinic for monoclonal antibody infusion.  Use the Zofran every 8 hours as needed for nausea.  Increase oral fluid intake to maintain hydration and help replace the  fluid you are losing with your diarrhea.  Do not use Imodium, Pepto-Bismol, or Kaopectate to stop the diarrhea.  Perform sinus irrigation twice daily with a [employer] sinus rinse kit and distilled water.  If you develop increasing abdominal pain, fever, increase in number of diarrhea stools and an inability to replace the fluid by mouth,, or increase in fatigue return for reevaluation.  If your shortness of breath increases, you develop shortness of breath at rest, or you cannot speak in full sentences you need to go to the ER for evaluation.    ED Prescriptions    Medication Sig Dispense Auth. Provider   ondansetron (ZOFRAN ODT) 8 MG disintegrating tablet Take 1 tablet (8 mg total) by mouth every 8 (eight) hours as needed for nausea or vomiting. 20 tablet Margarette Canada, NP     PDMP not reviewed this encounter.   Margarette Canada, NP 11/25/19 1154

## 2019-11-25 NOTE — ED Triage Notes (Signed)
Patient complains of bodyaches, chills, sweats, shortness of breath and diarrhea since Sunday. Patient states that she has had decreased appetite. States that she did take a rapid covid test yesterday and was negative.

## 2019-11-25 NOTE — Discharge Instructions (Addendum)
Isolate at home until the results of your Covid test are back.  If your Covid test is positive you will have to quarantine for 10 days from the start of your symptoms.  After 10 days you can break quarantine if your symptoms have improved and you have not had a fever for 24 hours.  If your Covid test is positive we will refer you to the infusion clinic for monoclonal antibody infusion.  Use the Zofran every 8 hours as needed for nausea.  Increase oral fluid intake to maintain hydration and help replace the fluid you are losing with your diarrhea.  Do not use Imodium, Pepto-Bismol, or Kaopectate to stop the diarrhea.  Perform sinus irrigation twice daily with a NeilMed sinus rinse kit and distilled water.  If you develop increasing abdominal pain, fever, increase in number of diarrhea stools and an inability to replace the fluid by mouth,, or increase in fatigue return for reevaluation.  If your shortness of breath increases, you develop shortness of breath at rest, or you cannot speak in full sentences you need to go to the ER for evaluation.

## 2020-04-16 ENCOUNTER — Telehealth: Payer: BC Managed Care – PPO | Admitting: Family

## 2020-04-16 ENCOUNTER — Encounter: Payer: Self-pay | Admitting: Family

## 2020-04-16 DIAGNOSIS — B9689 Other specified bacterial agents as the cause of diseases classified elsewhere: Secondary | ICD-10-CM

## 2020-04-16 DIAGNOSIS — N760 Acute vaginitis: Secondary | ICD-10-CM

## 2020-04-16 MED ORDER — METRONIDAZOLE 500 MG PO TABS: 500.0000 mg | ORAL_TABLET | Freq: Two times a day (BID) | ORAL | 0 refills | Status: DC

## 2020-04-16 NOTE — Progress Notes (Signed)
   Virtual Visit  Note Due to COVID-19 pandemic this visit was conducted virtually. This visit type was conducted due to national recommendations for restrictions regarding the COVID-19 Pandemic (e.g. social distancing, sheltering in place) in an effort to limit this patient's exposure and mitigate transmission in our community. All issues noted in this document were discussed and addressed.  A physical exam was not performed with this format.  I connected with Vicki Hill on 04/16/20 at 3:48 pm  By video  and verified that I am speaking with the correct person using two identifiers. Vicki Hill is currently located at home and no one  is currently with her during visit. The provider, Evelina Dun, FNP is located in their office at time of visit.  I discussed the limitations, risks, security and privacy concerns of performing an evaluation and management service by video  and the availability of in person appointments. I also discussed with the patient that there may be a patient responsible charge related to this service. The patient expressed understanding and agreed to proceed.   History and Present Illness:  Vaginal Discharge The patient's primary symptoms include genital itching, a genital odor, vaginal bleeding (menstrual bleeding) and vaginal discharge. This is a new problem. The current episode started 1 to 4 weeks ago. The problem occurs intermittently. The problem has been unchanged. The patient is experiencing no pain. Pertinent negatives include no dysuria. The vaginal discharge was milky. She has tried nothing for the symptoms. The treatment provided no relief.      Review of Systems  Genitourinary: Positive for vaginal discharge. Negative for dysuria.  All other systems reviewed and are negative.    Observations/Objective: No SOB or distress noted   Assessment and Plan: 1. BV (bacterial vaginosis) Keep clean and dry  Start probiotic daily Follow up if symptoms  worsen or do not improve  - metroNIDAZOLE (FLAGYL) 500 MG tablet; Take 1 tablet (500 mg total) by mouth 2 (two) times daily.  Dispense: 14 tablet; Refill: 0     I discussed the assessment and treatment plan with the patient. The patient was provided an opportunity to ask questions and all were answered. The patient agreed with the plan and demonstrated an understanding of the instructions.   The patient was advised to call back or seek an in-person evaluation if the symptoms worsen or if the condition fails to improve as anticipated.  The above assessment and management plan was discussed with the patient. The patient verbalized understanding of and has agreed to the management plan. Patient is aware to call the clinic if symptoms persist or worsen. Patient is aware when to return to the clinic for a follow-up visit. Patient educated on when it is appropriate to go to the emergency department.   Time call ended:  3:54 pm   I provided 6 minutes of   face-to-face time during this encounter.    Evelina Dun, FNP

## 2020-06-08 ENCOUNTER — Ambulatory Visit: Payer: Self-pay

## 2020-06-08 NOTE — Telephone Encounter (Signed)
Patient wanted to know her blood type. There is no documentation of type and screen. Pt has not had any inpatient hospital occurences.   Unable to provide requested information. Patient referred to [facility]. Or pt may go to lab for blood testing at her expense.

## 2020-06-11 ENCOUNTER — Other Ambulatory Visit (INDEPENDENT_AMBULATORY_CARE_PROVIDER_SITE_OTHER): Payer: Self-pay

## 2020-06-11 ENCOUNTER — Other Ambulatory Visit: Payer: Self-pay

## 2020-06-11 DIAGNOSIS — B9689 Other specified bacterial agents as the cause of diseases classified elsewhere: Secondary | ICD-10-CM

## 2020-06-14 ENCOUNTER — Encounter: Payer: Self-pay | Admitting: Physician Assistant

## 2020-06-14 ENCOUNTER — Telehealth: Payer: BC Managed Care – PPO | Admitting: Physician Assistant

## 2020-06-14 DIAGNOSIS — N760 Acute vaginitis: Secondary | ICD-10-CM

## 2020-06-14 DIAGNOSIS — B9689 Other specified bacterial agents as the cause of diseases classified elsewhere: Secondary | ICD-10-CM | POA: Diagnosis not present

## 2020-06-14 MED ORDER — METRONIDAZOLE 500 MG PO TABS: 500.0000 mg | ORAL_TABLET | Freq: Two times a day (BID) | ORAL | 0 refills | Status: DC

## 2020-06-14 NOTE — Progress Notes (Signed)
Ms. falan, hensler are scheduled for a virtual visit with your provider today.    Just as we do with appointments in the office, we must obtain your consent to participate.  Your consent will be active for this visit and any virtual visit you may have with one of our providers in the next 365 days.    If you have a MyChart account, I can also send a copy of this consent to you electronically.  All virtual visits are billed to your insurance company just like a traditional visit in the office.  As this is a virtual visit, video technology does not allow for your provider to perform a traditional examination.  This may limit your provider's ability to fully assess your condition.  If your provider identifies any concerns that need to be evaluated in person or the need to arrange testing such as labs, EKG, etc, we will make arrangements to do so.    Although advances in technology are sophisticated, we cannot ensure that it will always work on either your end or our end.  If the connection with a video visit is poor, we may have to switch to a telephone visit.  With either a video or telephone visit, we are not always able to ensure that we have a secure connection.   I need to obtain your verbal consent now.   Are you willing to proceed with your visit today?   BELLARAE LIZER has provided verbal consent on 06/14/2020 for a virtual visit (video or telephone).   Mar Daring, PA-C 06/14/2020  9:12 AM    MyChart Video Visit    Virtual Visit via Video Note   This visit type was conducted due to national recommendations for restrictions regarding the COVID-19 Pandemic (e.g. social distancing) in an effort to limit this patient's exposure and mitigate transmission in our community. This patient is at least at moderate risk for complications without adequate follow up. This format is felt to be most appropriate for this patient at this time. Physical exam was limited by quality of the video and audio  technology used for the visit.   Patient location: Home Provider location: Home office in [place]  I discussed the limitations of evaluation and management by telemedicine and the availability of in person appointments. The patient expressed understanding and agreed to proceed.  Patient: Vicki Hill   DOB: March 16, 1995   25 y.o. Female  MRN: [id number] Visit Date: 06/14/2020  Today's healthcare provider: Mar Daring, PA-C   No chief complaint on file.  Subjective    Vaginal Discharge The patient's primary symptoms include a genital odor and vaginal discharge. The patient's pertinent negatives include no pelvic pain. This is a recurrent problem. The current episode started in the past 7 days. The problem occurs constantly. The problem has been unchanged. The patient is experiencing no pain. She is not pregnant.    Patient has issues with recurrent BV secondary to menstrual cycles and changes in detergent recently. Last treated in April 2022.  There are no problems to display for this patient.  Past Medical History:  Diagnosis Date  . BV (bacterial vaginosis)    Recurrent      Medications: Outpatient Medications Prior to Visit  Medication Sig  . metroNIDAZOLE (FLAGYL) 500 MG tablet Take 1 tablet (500 mg total) by mouth 2 (two) times daily.  . ondansetron (ZOFRAN ODT) 8 MG disintegrating tablet Take 1 tablet (8 mg total) by mouth every 8 (eight)  hours as needed for nausea or vomiting.   No facility-administered medications prior to visit.    Review of Systems  Constitutional: Negative.   Respiratory: Negative.   Cardiovascular: Negative.   Gastrointestinal: Negative.   Genitourinary: Positive for vaginal discharge. Negative for pelvic pain.    Last CBC Lab Results  Component Value Date   WBC 7.9 03/20/2017   HGB 13.3 03/20/2017   HCT 40.3 03/20/2017   MCV 87.3 03/20/2017   MCH 28.9 03/20/2017   RDW 13.1 03/20/2017   PLT 391 16/10/9602   Last  metabolic panel Lab Results  Component Value Date   GLUCOSE 89 03/20/2017   NA 137 03/20/2017   K 3.5 03/20/2017   CL 103 03/20/2017   CO2 24 03/20/2017   BUN 10 03/20/2017   CREATININE 0.64 03/20/2017   GFRNONAA >60 03/20/2017   GFRAA >60 03/20/2017   CALCIUM 9.4 03/20/2017   PROT 7.8 03/20/2017   ALBUMIN 4.3 03/20/2017   BILITOT 0.6 03/20/2017   ALKPHOS 44 03/20/2017   AST 24 03/20/2017   ALT 16 03/20/2017   ANIONGAP 10 03/20/2017      Objective    There were no vitals taken for this visit. BP Readings from Last 3 Encounters:  11/25/19 122/84  10/23/19 115/71  07/17/19 112/79   Wt Readings from Last 3 Encounters:  11/25/19 210 lb (95.3 kg)  10/23/19 203 lb (92.1 kg)  07/17/19 197 lb 1.5 oz (89.4 kg)      Physical Exam Vitals reviewed.  Constitutional:      General: She is not in acute distress.    Appearance: Normal appearance. She is well-developed. She is not ill-appearing.  HENT:     Head: Normocephalic and atraumatic.  Pulmonary:     Effort: Pulmonary effort is normal. No respiratory distress.  Musculoskeletal:     Cervical back: Normal range of motion and neck supple.  Neurological:     Mental Status: She is alert.  Psychiatric:        Mood and Affect: Mood normal.        Behavior: Behavior normal.        Thought Content: Thought content normal.        Judgment: Judgment normal.        Assessment & Plan     1. BV (bacterial vaginosis) - Recurrent issue due to menstrual cycles and changes in soaps or detergents - Will treat with Metronidazole as below - Advised to discuss more treatment options with PCP or GYN for recurrent BV, such as vaginal probiotics or boric acid suppositories. - metroNIDAZOLE (FLAGYL) 500 MG tablet; Take 1 tablet (500 mg total) by mouth 2 (two) times daily.  Dispense: 14 tablet; Refill: 0   No follow-ups on file.     I discussed the assessment and treatment plan with the patient. The patient was provided an  opportunity to ask questions and all were answered. The patient agreed with the plan and demonstrated an understanding of the instructions.   The patient was advised to call back or seek an in-person evaluation if the symptoms worsen or if the condition fails to improve as anticipated.  I provided 12 minutes of face-to-face time during this encounter via MyChart Video enabled encounter.   [place] [phone] (phone) [phone] (fax)  [place]

## 2020-06-14 NOTE — Patient Instructions (Signed)
Bacterial Vaginosis  Bacterial vaginosis is an infection that occurs when the normal balance of bacteria in the vagina changes. This change is caused by an overgrowth of certain bacteria in the vagina. Bacterial vaginosis is the most common vaginal infection among females aged 27 to 32 years. This condition increases the risk of sexually transmitted infections (STIs). Treatment can help reduce this risk. Treatment is very important for pregnant women because this condition can cause babies to be born early (prematurely) or at a low birth weight. What are the causes? This condition is caused by an increase in harmful bacteria that are normally present in small amounts in the vagina. However, the exact reason this condition develops is not known. You cannot get bacterial vaginosis from toilet seats, bedding, swimming pools, or contact with objects around you. What increases the risk? The following factors may make you more likely to develop this condition:  Having a new sexual partner or multiple sexual partners, or having unprotected sex.  Douching.  Having an intrauterine device (IUD).  Smoking.  Abusing drugs and alcohol. This may lead to riskier sexual behavior.  Taking certain antibiotic medicines.  Being pregnant. What are the signs or symptoms? Some women with this condition have no symptoms. Symptoms may include:  Pearline Cables or white vaginal discharge. The discharge can be watery or foamy.  A fish-like odor with discharge, especially after sex or during menstruation.  Itching in and around the vagina.  Burning or pain with urination. How is this diagnosed? This condition is diagnosed based on:  Your medical history.  A physical exam of the vagina.  Checking a sample of vaginal fluid for harmful bacteria or abnormal cells. How is this treated? This condition is treated with antibiotic medicines. These may be given as a pill, a vaginal cream, or a medicine that is put into the  vagina (suppository). If the condition comes back after treatment, a second round of antibiotics may be needed. Follow these instructions at home: Medicines  Take or apply over-the-counter and prescription medicines only as told by your health care provider.  Take or apply your antibiotic medicine as told by your health care provider. Do not stop using the antibiotic even if you start to feel better. General instructions  If you have a female sexual partner, tell her that you have a vaginal infection. She should follow up with her health care provider. If you have a female sexual partner, he does not need treatment.  Avoid sexual activity until you finish treatment.  Drink enough fluid to keep your urine pale yellow.  Keep the area around your vagina and rectum clean. ? Wash the area daily with warm water. ? Wipe yourself from front to back after using the toilet.  If you are breastfeeding, talk to your health care provider about continuing breastfeeding during treatment.  Keep all follow-up visits. This is important. How is this prevented? Self-care  Do not douche.  Wash the outside of your vagina with warm water only.  Wear cotton or cotton-lined underwear.  Avoid wearing tight pants and pantyhose, especially during the summer. Safe sex  Use protection when having sex. This includes: ? Using condoms. ? Using dental dams. This is a thin layer of a material made of latex or polyurethane that protects the mouth during oral sex.  Limit the number of sexual partners. To help prevent bacterial vaginosis, it is best to have sex with just one partner (monogamous relationship).  Make sure you and your sexual partner  are tested for STIs. Drugs and alcohol  Do not use any products that contain nicotine or tobacco. These products include cigarettes, chewing tobacco, and vaping devices, such as e-cigarettes. If you need help quitting, ask your health care provider.  Do not use  drugs.  Do not drink alcohol if: ? Your health care provider tells you not to do this. ? You are pregnant, may be pregnant, or are planning to become pregnant.  If you drink alcohol: ? Limit how much you have to 0-1 drink a day. ? Be aware of how much alcohol is in your drink. In the [place] one drink equals one 12 oz bottle of beer (355 mL), one 5 oz glass of wine (148 mL), or one 1 oz glass of hard liquor (44 mL). Where to find more information  Centers for Disease Control and Prevention: http://www.wolf.info/  American Sexual Health Association (ASHA): www.ashastd.org  [place] Department of Health and Financial controller, Office on Women's Health: [place] Contact a health care provider if:  Your symptoms do not improve, even after treatment.  You have more discharge or pain when urinating.  You have a fever or chills.  You have pain in your abdomen or pelvis.  You have pain during sex.  You have vaginal bleeding between menstrual periods. Summary  Bacterial vaginosis is a vaginal infection that occurs when the normal balance of bacteria in the vagina changes. It results from an overgrowth of certain bacteria.  This condition increases the risk of sexually transmitted infections (STIs). Getting treated can help reduce this risk.  Treatment is very important for pregnant women because this condition can cause babies to be born early (prematurely) or at low birth weight.  This condition is treated with antibiotic medicines. These may be given as a pill, a vaginal cream, or a medicine that is put into the vagina (suppository). This information is not intended to replace advice given to you by your health care provider. Make sure you discuss any questions you have with your health care provider. Document Revised: 06/26/2019 Document Reviewed: 06/26/2019 Elsevier Patient Education  [place].

## 2020-07-28 ENCOUNTER — Telehealth: Payer: 59 | Admitting: Family Medicine

## 2020-07-28 ENCOUNTER — Encounter: Payer: BC Managed Care – PPO | Admitting: Physician Assistant

## 2020-07-28 ENCOUNTER — Telehealth: Payer: Self-pay

## 2020-07-28 ENCOUNTER — Encounter: Payer: Self-pay | Admitting: Family Medicine

## 2020-07-28 DIAGNOSIS — B9689 Other specified bacterial agents as the cause of diseases classified elsewhere: Secondary | ICD-10-CM

## 2020-07-28 DIAGNOSIS — N760 Acute vaginitis: Secondary | ICD-10-CM

## 2020-07-28 DIAGNOSIS — B373 Candidiasis of vulva and vagina: Secondary | ICD-10-CM | POA: Diagnosis not present

## 2020-07-28 DIAGNOSIS — B3731 Acute candidiasis of vulva and vagina: Secondary | ICD-10-CM

## 2020-07-28 MED ORDER — METRONIDAZOLE 500 MG PO TABS: 500.0000 mg | ORAL_TABLET | Freq: Two times a day (BID) | ORAL | 0 refills | Status: DC

## 2020-07-28 MED ORDER — FLUCONAZOLE 150 MG PO TABS: 150.0000 mg | ORAL_TABLET | Freq: Every day | ORAL | 0 refills | Status: DC

## 2020-07-28 NOTE — Progress Notes (Signed)
Ms. Vicki Hill, lightcap are scheduled for a virtual visit with your provider today.    Just as we do with appointments in the office, we must obtain your consent to participate.  Your consent will be active for this visit and any virtual visit you may have with one of our providers in the next 365 days.    If you have a MyChart account, I can also send a copy of this consent to you electronically.  All virtual visits are billed to your insurance company just like a traditional visit in the office.  As this is a virtual visit, video technology does not allow for your provider to perform a traditional examination.  This may limit your provider's ability to fully assess your condition.  If your provider identifies any concerns that need to be evaluated in person or the need to arrange testing such as labs, EKG, etc, we will make arrangements to do so.    Although advances in technology are sophisticated, we cannot ensure that it will always work on either your end or our end.  If the connection with a video visit is poor, we may have to switch to a telephone visit.  With either a video or telephone visit, we are not always able to ensure that we have a secure connection.   I need to obtain your verbal consent now.   Are you willing to proceed with your visit today?   [name] has provided verbal consent on 07/28/2020 for a virtual visit (video or telephone).   [name], NP 07/28/2020  5:02 PM   Date:  07/28/2020   ID:  Vicki Hill, DOB [birth year], MRN [id number]  Patient Location: Home Provider Location: Home Office   Participants: Patient and Provider for Visit and Wrap up  Method of visit: Video  Location of Patient: Home Location of Provider: Home Office Consent was obtain for visit over the video. Services rendered by provider: Visit was performed via video  A video enabled telemedicine application was used and I verified that I am speaking with the correct person using two  identifiers.  PCP:  Patient, No Pcp Per (Inactive)   Chief Complaint:  yeast/bv   History of Present Illness:    Vicki Hill is a 25 y.o. female with history as stated below. Presents video telehealth for an acute care visit for yeast infection.  Symptoms include genital itching, a genital odor, and vaginal discharge. This is a new problem-but a recurrent issue. The current episode started 1 to 4 weeks ago. The problem has been unchanged- and usually requires treatment every few months. The patient is experiencing no pain. Pertinent negatives include no dysuria. The vaginal discharge was milky with odor and at times has a cottage cheese look. She has tried nothing for the symptoms.   Has had treatment for yeast and BV a few times this year.  Denies recent intercourse with anyone new or unprotected intercourse. She reports history of this for years. Tried boric acid supp but was not a fan of them   Past Medical, Surgical, Social History, Allergies, and Medications have been Reviewed.  Past Medical History:  Diagnosis Date   BV (bacterial vaginosis)    Recurrent    No outpatient medications have been marked as taking for the 07/28/20 encounter (Appointment) with [name], NP.     Allergies:   Patient has no known allergies.   ROS See HPI for history of present illness.  Physical Exam  Constitutional:      Appearance: Normal appearance.  HENT:     Head: Normocephalic.     Nose: Nose normal.  Eyes:     Conjunctiva/sclera: Conjunctivae normal.  Pulmonary:     Effort: Pulmonary effort is normal.  Skin:    Coloration: Skin is not jaundiced.  Neurological:     General: No focal deficit present.     Mental Status: She is alert.  Psychiatric:        Mood and Affect: Mood normal.        Behavior: Behavior normal.              A&P  1. BV (bacterial vaginosis) Good hygeiene provided Does not feel like boric acid is for her Willing to do treatment w flagyl  ordered as below - Reviewed side effects, risks and benefits of medication.   Patient acknowledged agreement and understanding of the plan.    - metroNIDAZOLE (FLAGYL) 500 MG tablet; Take 1 tablet (500 mg total) by mouth 2 (two) times daily.  Dispense: 14 tablet; Refill: 0  2. Yeast vaginitis -good hygiene reviewed -diflucan provided - Reviewed side effects, risks and benefits of medication.   Patient acknowledged agreement and understanding of the plan.    - fluconazole (DIFLUCAN) 150 MG tablet; Take 1 tablet (150 mg total) by mouth daily. Repeat if needed in 3 days  Dispense: 2 tablet; Refill: 0   I discussed the assessment and treatment plan with the patient. The patient was provided an opportunity to ask questions and all were answered. The patient agreed with the plan and demonstrated an understanding of the instructions.   The patient was advised to call back or seek an in-person evaluation if the symptoms worsen or if the condition fails to improve as anticipated.   The above assessment and management plan was discussed with the patient. The patient verbalized understanding of and has agreed to the management plan. Patient is aware to call the clinic if symptoms persist or worsen. Patient is aware when to return to the clinic for a follow-up visit. Patient educated on when it is appropriate to go to the emergency department.    Time:   Today, I have spent 10 minutes with the patient with telehealth technology discussing the above problems, reviewing the chart, previous notes, medications and orders.   Medication Changes: No orders of the defined types were placed in this encounter.    Disposition:  Follow up new PCP in Sept or here as needed  Signed, [name], NP  07/28/2020 5:02 PM

## 2020-07-28 NOTE — Progress Notes (Signed)
Erroneous encounter. Duplicate. Please disregard.

## 2020-08-25 ENCOUNTER — Ambulatory Visit
Admission: RE | Admit: 2020-08-25 | Discharge: 2020-08-25 | Disposition: A | Discharge status: Home / Self Care | Payer: 59 | Source: Ambulatory Visit | Attending: Emergency Medicine | Admitting: Emergency Medicine

## 2020-08-25 ENCOUNTER — Other Ambulatory Visit: Payer: Self-pay

## 2020-08-25 VITALS — BP 116/90 | HR 92 | Temp 98.7°F | Resp 18

## 2020-08-25 DIAGNOSIS — N760 Acute vaginitis: Secondary | ICD-10-CM | POA: Diagnosis not present

## 2020-08-25 DIAGNOSIS — B9689 Other specified bacterial agents as the cause of diseases classified elsewhere: Secondary | ICD-10-CM | POA: Diagnosis not present

## 2020-08-25 LAB — WET PREP, GENITAL
Sperm: NONE SEEN
Trich, Wet Prep: NONE SEEN
Yeast Wet Prep HPF POC: NONE SEEN

## 2020-08-25 LAB — CHLAMYDIA/NGC RT PCR (ARMC ONLY)
Chlamydia Tr: NOT DETECTED
N gonorrhoeae: NOT DETECTED

## 2020-08-25 MED ORDER — METRONIDAZOLE 500 MG PO TABS: 500.0000 mg | ORAL_TABLET | Freq: Two times a day (BID) | ORAL | 0 refills | Status: DC

## 2020-08-25 MED ORDER — RIZATRIPTAN BENZOATE 10 MG PO TABS
10.0000 mg | ORAL_TABLET | ORAL | 0 refills | Status: DC | PRN
Start: 2020-08-25 — End: 2022-02-15

## 2020-08-25 NOTE — Discharge Instructions (Addendum)
Bacterial vaginosis: Take the Flagyl twice daily for 7 days.  Do not drink alcohol while you take this medication as it will cause vomiting.  For your migraine: Use the Maxalt as needed for your migraine headache.  You may take 1 tablet at the outset and repeat in 2 hours if you have had no relief.  Do not take more than 3 tablets in a 24-hour period.  You may continue to use Excedrin Migraine as needed for body aches and headache.  Your gonorrhea and Clenia testing will be back in 1 to 2 days.  If either 1 of those test is positive we will treat you at that time.

## 2020-08-25 NOTE — ED Triage Notes (Signed)
Pt presents today with "migraine" x 2 days (hx of same). Denies n/v/d. She also c/o of vaginal discharge with odor x 5 days. She reports recently having unprotected sex. She also c/o mid lower back pain x 3 days. Denies injury. Denies dysuria.

## 2020-08-25 NOTE — ED Provider Notes (Signed)
[facility]    CSN: [id number] Arrival date & time: 08/25/20  1445      History   Chief Complaint Chief Complaint  Patient presents with   Migraine   Vaginal Discharge   Back Pain    HPI Vicki Hill is a 25 y.o. female.   HPI  25 year old female here for evaluation of multiple complaints.  Patient's primary complaint is that she has been experiencing a migraine headache for the last 2 days.  She states that she has been experiencing migraines for approximate the past 6 years but she is never seen anybody about them.  She states that the pain is behind her eyes and goes up to the heart of her head to the crown.  This is associated with nausea, light sensitivity, and noise sensitivity.  She states that her headaches always present this way and typically respond to Excedrin Migraine but this time they have not.  Her secondary complaint involves white, runny vaginal discharge with a fishlike odor.  This is associated with midline low back pain and suprapubic pain.  She denies any fever, pain with urination, urinary urgency or frequency, or blood in her urine.  She reports that she did recently have intercourse and had some spotting afterwards but she was only 1 to 2 days after her menstrual cycle had completed.  She has had no vaginal bleeding since.  Past Medical History:  Diagnosis Date   BV (bacterial vaginosis)    Recurrent    There are no problems to display for this patient.   Past Surgical History:  Procedure Laterality Date   NO PAST SURGERIES      OB History   No obstetric history on file.      Home Medications    Prior to Admission medications   Medication Sig Start Date End Date Taking? Authorizing Provider  metroNIDAZOLE (FLAGYL) 500 MG tablet Take 1 tablet (500 mg total) by mouth 2 (two) times daily. 08/25/20  Yes Margarette Canada, NP  rizatriptan (MAXALT) 10 MG tablet Take 1 tablet (10 mg total) by mouth as needed for migraine. May repeat in  2 hours if needed 08/25/20  Yes Margarette Canada, NP    Family History History reviewed. No pertinent family history.  Social History Social History   Tobacco Use   Smoking status: Never   Smokeless tobacco: Never  Vaping Use   Vaping Use: Never used  Substance Use Topics   Alcohol use: Yes    Comment: occ   Drug use: No     Allergies   Patient has no known allergies.   Review of Systems Review of Systems  Constitutional:  Negative for activity change, appetite change and fever.  Eyes:  Negative for visual disturbance.  Gastrointestinal:  Positive for abdominal pain and nausea. Negative for diarrhea and vomiting.  Genitourinary:  Positive for vaginal bleeding, vaginal discharge and vaginal pain. Negative for dysuria, frequency, hematuria and urgency.  Musculoskeletal:  Positive for back pain.  Neurological:  Positive for headaches. Negative for dizziness, syncope, weakness and numbness.  Hematological: Negative.   Psychiatric/Behavioral: Negative.      Physical Exam Triage Vital Signs ED Triage Vitals  Enc Vitals Group     BP      Pulse      Resp      Temp      Temp src      SpO2      Weight      Height  Head Circumference      Peak Flow      Pain Score      Pain Loc      Pain Edu?      Excl. in [place]?    No data found.  Updated Vital Signs BP 116/90 (BP Location: Right Arm)   Pulse 92   Temp 98.7 F (37.1 C) (Oral)   Resp 18   LMP 08/08/2020 (Exact Date)   SpO2 99%   Visual Acuity Right Eye Distance:   Left Eye Distance:   Bilateral Distance:    Right Eye Near:   Left Eye Near:    Bilateral Near:     Physical Exam Vitals and nursing note reviewed.  Constitutional:      General: She is not in acute distress.    Appearance: Normal appearance. She is not ill-appearing.  HENT:     Head: Normocephalic and atraumatic.  Cardiovascular:     Rate and Rhythm: Normal rate and regular rhythm.     Pulses: Normal pulses.     Heart sounds: Normal  heart sounds. No murmur heard.   No gallop.  Pulmonary:     Effort: Pulmonary effort is normal.     Breath sounds: Normal breath sounds. No wheezing, rhonchi or rales.  Abdominal:     Palpations: Abdomen is soft.     Tenderness: There is abdominal tenderness. There is no right CVA tenderness, left CVA tenderness, guarding or rebound.  Musculoskeletal:        General: Tenderness present.  Skin:    General: Skin is warm and dry.     Capillary Refill: Capillary refill takes less than 2 seconds.  Neurological:     General: No focal deficit present.     Mental Status: She is alert and oriented to person, place, and time.     Cranial Nerves: No cranial nerve deficit.     Motor: No weakness.  Psychiatric:        Mood and Affect: Mood normal.        Behavior: Behavior normal.        Thought Content: Thought content normal.        Judgment: Judgment normal.     UC Treatments / Results  Labs (all labs ordered are listed, but only abnormal results are displayed) Labs Reviewed  WET PREP, GENITAL - Abnormal; Notable for the following components:      Result Value   Clue Cells Wet Prep HPF POC PRESENT (*)    WBC, Wet Prep HPF POC FEW (*)    All other components within normal limits  CHLAMYDIA/NGC RT PCR [facility] ONLY)              EKG   Radiology No results found.  Procedures Procedures (including critical care time)  Medications Ordered in UC Medications - No data to display  Initial Impression / Assessment and Plan / UC Course  I have reviewed the triage vital signs and the nursing notes.  Pertinent labs & imaging results that were available during my care of the patient were reviewed by me and considered in my medical decision making (see chart for details).  Patient is a very pleasant, nontoxic-appearing 36 old female here for evaluation of migraine headache for 2 days, vaginal discharge for 5 days, and low back pain for 3 days.  As outlined in HPI she is never sought care  for treatment for her migraines as they typically respond to over-the-counter Excedrin Migraine.  This  time they have not abated with over-the-counter medication.  She reports that the vaginal discharge is thin, white, and has a fishy odor.  Patient does have an extensive history of bacterial vaginosis.  She states that she has been having unprotected sex with a monogamous partner and she denies any concern for STIs.  Patient's physical exam reveals cranial nerves II through XII grossly intact.  Patient's cardiopulmonary exam reveals clear lung sounds in all fields.  Heart sounds are S1-S2 and crisp.  Patient has no CVA tenderness but she does have central low back pain.  No midline spinal tenderness but she does have pain just to the left and right of midline and the paraspinous region.  There is no overt spasm noted on exam.  Abdomen is soft with mild suprapubic tenderness.  Gonorrhea chlamydia swab collected at triage.  Wet prep shows presence of clue cells but is negative for yeast or trichomoniasis.  Gonorrhea and Chlamydia testing is pending.  Will treat patient with Flagyl twice daily for 7 days for her bacterial vaginosis and we will also prescribe Maxalt 10 mg for her migraines.  She can continue to use Excedrin Migraine as needed for mild to moderate pain.  Work note provided.  Final Clinical Impressions(s) / UC Diagnoses   Final diagnoses:  BV (bacterial vaginosis)   Discharge Instructions   None    ED Prescriptions     Medication Sig Dispense Auth. Provider   metroNIDAZOLE (FLAGYL) 500 MG tablet Take 1 tablet (500 mg total) by mouth 2 (two) times daily. 14 tablet Margarette Canada, NP   rizatriptan (MAXALT) 10 MG tablet Take 1 tablet (10 mg total) by mouth as needed for migraine. May repeat in 2 hours if needed 10 tablet Margarette Canada, NP      PDMP not reviewed this encounter.   Margarette Canada, NP 08/25/20 1556

## 2020-09-08 ENCOUNTER — Telehealth: Payer: 59 | Admitting: Family

## 2020-09-08 ENCOUNTER — Telehealth: Payer: 59 | Admitting: Physician Assistant

## 2020-09-08 DIAGNOSIS — N760 Acute vaginitis: Secondary | ICD-10-CM | POA: Diagnosis not present

## 2020-09-08 DIAGNOSIS — B9689 Other specified bacterial agents as the cause of diseases classified elsewhere: Secondary | ICD-10-CM | POA: Diagnosis not present

## 2020-09-08 MED ORDER — METRONIDAZOLE 500 MG PO TABS: 500.0000 mg | ORAL_TABLET | Freq: Two times a day (BID) | ORAL | 0 refills | Status: DC

## 2020-09-08 NOTE — Progress Notes (Signed)
Virtual Visit Consent   LIBERTY RUSCITTO, you are scheduled for a virtual visit with a [facility] provider today.     Just as with appointments in the office, your consent must be obtained to participate.  Your consent will be active for this visit and any virtual visit you may have with one of our providers in the next 365 days.     If you have a MyChart account, a copy of this consent can be sent to you electronically.  All virtual visits are billed to your insurance company just like a traditional visit in the office.    As this is a virtual visit, video technology does not allow for your provider to perform a traditional examination.  This may limit your provider's ability to fully assess your condition.  If your provider identifies any concerns that need to be evaluated in person or the need to arrange testing (such as labs, EKG, etc.), we will make arrangements to do so.     Although advances in technology are sophisticated, we cannot ensure that it will always work on either your end or our end.  If the connection with a video visit is poor, the visit may have to be switched to a telephone visit.  With either a video or telephone visit, we are not always able to ensure that we have a secure connection.     I need to obtain your verbal consent now.   Are you willing to proceed with your visit today?    NIARA STAHMER has provided verbal consent on 09/08/2020 for a virtual visit (video or telephone).   Evelina Dun, FNP   Date: 09/08/2020 7:16 PM   Virtual Visit via Video Note   I, Evelina Dun, connected with  LEANORE PELLICER  ([id number], [birth year]) on 09/08/20 at  7:00 PM EDT by a video-enabled telemedicine application and verified that I am speaking with the correct person using two identifiers.  Location: Patient: Virtual Visit Location Patient: Other: car Provider: Virtual Visit Location Provider: Home   I discussed the limitations of evaluation and management by  telemedicine and the availability of in person appointments. The patient expressed understanding and agreed to proceed.    History of Present Illness: Vicki Hill is a 25 y.o. who identifies as a female who was assigned female at birth, and is being seen today for Vaginal discharge. She was seen at the Urgent Care on 08/25/20 for BV. She was given Flagyl and was negative for STD. She states she started the flagyl, but started her period. She states her symptoms returned once she stopped her period.   HPI: Vaginal Discharge The patient's primary symptoms include vaginal discharge.   Problems: There are no problems to display for this patient.   Allergies: No Known Allergies Medications:  Current Outpatient Medications:    metroNIDAZOLE (FLAGYL) 500 MG tablet, Take 1 tablet (500 mg total) by mouth 2 (two) times daily., Disp: 14 tablet, Rfl: 0   rizatriptan (MAXALT) 10 MG tablet, Take 1 tablet (10 mg total) by mouth as needed for migraine. May repeat in 2 hours if needed, Disp: 10 tablet, Rfl: 0  Observations/Objective: Patient is well-developed, well-nourished in no acute distress.  Resting comfortably  at home.  Head is normocephalic, atraumatic.  No labored breathing.  Speech is clear and coherent with logical content.  Patient is alert and oriented at baseline.    Assessment and Plan: 1. BV (bacterial vaginosis) - metroNIDAZOLE (FLAGYL) 500 MG  tablet; Take 1 tablet (500 mg total) by mouth 2 (two) times daily.  Dispense: 14 tablet; Refill: 0 Avoid douching Will give another flagyl dose If symptoms return will need to follow up with GYN Avoid gels or harsh soap  Follow Up Instructions: I discussed the assessment and treatment plan with the patient. The patient was provided an opportunity to ask questions and all were answered. The patient agreed with the plan and demonstrated an understanding of the instructions.  A copy of instructions were sent to the patient via  MyChart.  The patient was advised to call back or seek an in-person evaluation if the symptoms worsen or if the condition fails to improve as anticipated.  Time:  I spent 13 minutes with the patient via telehealth technology discussing the above problems/concerns.    Evelina Dun, FNP

## 2020-09-08 NOTE — Progress Notes (Signed)
Erroneous encounter. Patient seen via video visit.

## 2020-09-28 ENCOUNTER — Ambulatory Visit: Payer: Self-pay | Admitting: Family Medicine

## 2020-10-05 ENCOUNTER — Ambulatory Visit: Payer: Self-pay | Admitting: Family Medicine

## 2020-10-06 ENCOUNTER — Telehealth: Payer: 59 | Admitting: Physician Assistant

## 2020-10-06 DIAGNOSIS — T3695XA Adverse effect of unspecified systemic antibiotic, initial encounter: Secondary | ICD-10-CM | POA: Diagnosis not present

## 2020-10-06 DIAGNOSIS — B379 Candidiasis, unspecified: Secondary | ICD-10-CM | POA: Diagnosis not present

## 2020-10-07 MED ORDER — FLUCONAZOLE 150 MG PO TABS: 150.0000 mg | ORAL_TABLET | Freq: Once | ORAL | 0 refills | Status: AC

## 2020-10-07 NOTE — Progress Notes (Signed)
E-Visit for Vaginal Symptoms  We are sorry that you are not feeling well. Here is how we plan to help! Based on what you shared with me it looks like you: May have a yeast vaginosis  Vaginosis is an inflammation of the vagina that can result in discharge, itching and pain. The cause is usually a change in the normal balance of vaginal bacteria or an infection. Vaginosis can also result from reduced estrogen levels after menopause.  The most common causes of vaginosis are:   Bacterial vaginosis which results from an overgrowth of one on several organisms that are normally present in your vagina.   Yeast infections which are caused by a naturally occurring fungus called candida.   Vaginal atrophy (atrophic vaginosis) which results from the thinning of the vagina from reduced estrogen levels after menopause.   Trichomoniasis which is caused by a parasite and is commonly transmitted by sexual intercourse.  Factors that increase your risk of developing vaginosis include: Medications, such as antibiotics and steroids Uncontrolled diabetes Use of hygiene products such as bubble bath, vaginal spray or vaginal deodorant Douching Wearing damp or tight-fitting clothing Using an intrauterine device (IUD) for birth control Hormonal changes, such as those associated with pregnancy, birth control pills or menopause Sexual activity Having a sexually transmitted infection  Your treatment plan is A single Diflucan (fluconazole) 150mg  tablet once.  I have electronically sent this prescription into the pharmacy that you have chosen.  Be sure to take all of the medication as directed. Stop taking any medication if you develop a rash, tongue swelling or shortness of breath. Mothers who are breast feeding should consider pumping and discarding their breast milk while on these antibiotics. However, there is no consensus that infant exposure at these doses would be harmful.  Remember that medication creams can  weaken latex condoms. . Giving recurring bouts with yeast and BV, you need to schedule an appointment with a PCP or GYN to further discuss so things can be started to help prevent these bouts as we are not allowed to continue treating this recurring issue via e-visit.    HOME CARE:  Good hygiene may prevent some types of vaginosis from recurring and may relieve some symptoms:  Avoid baths, hot tubs and whirlpool spas. Rinse soap from your outer genital area after a shower, and dry the area well to prevent irritation. Don't use scented or harsh soaps, such as those with deodorant or antibacterial action. Avoid irritants. These include scented tampons and pads. Wipe from front to back after using the toilet. Doing so avoids spreading fecal bacteria to your vagina.  Other things that may help prevent vaginosis include:  Don't douche. Your vagina doesn't require cleansing other than normal bathing. Repetitive douching disrupts the normal organisms that reside in the vagina and can actually increase your risk of vaginal infection. Douching won't clear up a vaginal infection. Use a latex condom. Both female and female latex condoms may help you avoid infections spread by sexual contact. Wear cotton underwear. Also wear pantyhose with a cotton crotch. If you feel comfortable without it, skip wearing underwear to bed. Yeast thrives in [employer] Your symptoms should improve in the next day or two.  GET HELP RIGHT AWAY IF:  You have pain in your lower abdomen ( pelvic area or over your ovaries) You develop nausea or vomiting You develop a fever Your discharge changes or worsens You have persistent pain with intercourse You develop shortness of breath, a rapid pulse,  or you faint.  These symptoms could be signs of problems or infections that need to be evaluated by a medical provider now.  MAKE SURE YOU   Understand these instructions. Will watch your condition. Will get help right  away if you are not doing well or get worse.  Thank you for choosing an e-visit.  Your e-visit answers were reviewed by a board certified advanced clinical practitioner to complete your personal care plan. Depending upon the condition, your plan could have included both over the counter or prescription medications.  Please review your pharmacy choice. Make sure the pharmacy is open so you can pick up prescription now. If there is a problem, you may contact your provider through [employer] and have the prescription routed to another pharmacy.  Your safety is important to [place]. If you have drug allergies check your prescription carefully.   For the next 24 hours you can use MyChart to ask questions about today's visit, request a non-urgent call back, or ask for a work or school excuse. You will get an email in the next two days asking about your experience. I hope that your e-visit has been valuable and will speed your recovery.

## 2020-10-07 NOTE — Progress Notes (Signed)
I have spent 5 minutes in review of e-visit questionnaire, review and updating patient chart, medical decision making and response to patient.   Shakyra Mattera Cody Tasha Diaz, PA-C    

## 2020-11-07 ENCOUNTER — Other Ambulatory Visit: Payer: Self-pay

## 2020-11-07 ENCOUNTER — Ambulatory Visit
Admission: EM | Admit: 2020-11-07 | Discharge: 2020-11-07 | Disposition: A | Discharge status: Home / Self Care | Payer: 59 | Attending: Medical Oncology | Admitting: Medical Oncology

## 2020-11-07 ENCOUNTER — Encounter: Payer: Self-pay | Admitting: Emergency Medicine

## 2020-11-07 DIAGNOSIS — L739 Follicular disorder, unspecified: Secondary | ICD-10-CM

## 2020-11-07 MED ORDER — DOXYCYCLINE HYCLATE 100 MG PO CAPS: 100.0000 mg | ORAL_CAPSULE | Freq: Two times a day (BID) | ORAL | 0 refills | Status: DC

## 2020-11-07 NOTE — ED Provider Notes (Addendum)
[facility]    CSN: [id number] Arrival date & time: 11/07/20  1416      History   Chief Complaint Chief Complaint  Patient presents with   Abscess    HPI Vicki Hill is a 25 y.o. female.   HPI  Abscess: Pt reports that one week ago she noticed a tender bump near her left upper thigh/groin after shaving this area. This has improved in terms of color, pain and size some but she still has a bump there. This is unusual for her so she presents today to get it assessed. She denies bleeding, pain, discharge, other similar lesions. She has not applied anything to this area.   Past Medical History:  Diagnosis Date   BV (bacterial vaginosis)    Recurrent    There are no problems to display for this patient.   Past Surgical History:  Procedure Laterality Date   NO PAST SURGERIES      OB History   No obstetric history on file.      Home Medications    Prior to Admission medications   Medication Sig Start Date End Date Taking? Authorizing Provider  metroNIDAZOLE (FLAGYL) 500 MG tablet Take 1 tablet (500 mg total) by mouth 2 (two) times daily. 09/08/20   Sharion Balloon, FNP  rizatriptan (MAXALT) 10 MG tablet Take 1 tablet (10 mg total) by mouth as needed for migraine. May repeat in 2 hours if needed 08/25/20   Margarette Canada, NP    Family History History reviewed. No pertinent family history.  Social History Social History   Tobacco Use   Smoking status: Never   Smokeless tobacco: Never  Vaping Use   Vaping Use: Never used  Substance Use Topics   Alcohol use: Yes    Comment: occ   Drug use: No     Allergies   Patient has no known allergies.   Review of Systems Review of Systems  As stated above in HPI Physical Exam Triage Vital Signs ED Triage Vitals  Enc Vitals Group     BP 11/07/20 1500 133/86     Pulse Rate 11/07/20 1500 94     Resp 11/07/20 1500 14     Temp 11/07/20 1500 99.2 F (37.3 C)     Temp Source 11/07/20 1500 Oral      SpO2 11/07/20 1500 98 %     Weight 11/07/20 1458 203 lb (92.1 kg)     Height 11/07/20 1458 5\' 3"  (1.6 m)     Head Circumference --      Peak Flow --      Pain Score 11/07/20 1458 0     Pain Loc --      Pain Edu? --      Excl. in [place]? --    No data found.  Updated Vital Signs BP 133/86 (BP Location: Right Arm)   Pulse 94   Temp 99.2 F (37.3 C) (Oral)   Resp 14   Ht 5\' 3"  (1.6 m)   Wt 203 lb (92.1 kg)   LMP 11/02/2020 (Exact Date)   SpO2 98%   BMI 35.96 kg/m   Physical Exam Vitals and nursing note reviewed.  Skin:    General: Skin is warm.     Findings: Lesion (There is a 86mm round slightly hyperpigmented area of healing skin with central resolved pustule formation. This lesion has a slightly firm texture under the skin. No induration or fluctuance.) present.     UC Treatments /  Results  Labs (all labs ordered are listed, but only abnormal results are displayed) Labs Reviewed - No data to display  EKG   Radiology No results found.  Procedures Procedures (including critical care time)  Medications Ordered in UC Medications - No data to display  Initial Impression / Assessment and Plan / UC Course  I have reviewed the triage vital signs and the nursing notes.  Pertinent labs & imaging results that were available during my care of the patient were reviewed by me and considered in my medical decision making (see chart for details).     New. This appears to be a resolving area of folliculitis with possible dermatofibroma formation. Discussed this with patient in detail. She would benefit from warm soaks, avoiding aggravating the area, applying aquaphor and monitoring. Discussed red flag signs and symptoms. Follow up PRN. Should this reappear as it is possible a hair entrapped is the cause then she will start the ABX provided.    Final Clinical Impressions(s) / UC Diagnoses   Final diagnoses:  None   Discharge Instructions   None    ED Prescriptions    None    PDMP not reviewed this encounter.   Hughie Closs, PA-C 11/07/20 [place], PA-C 11/07/20 1544

## 2020-11-07 NOTE — ED Triage Notes (Signed)
Patient states that while she was shaving she noticed one tender bump on the left side of her vagina near the top of her thigh a week ago.

## 2020-11-30 ENCOUNTER — Ambulatory Visit (INDEPENDENT_AMBULATORY_CARE_PROVIDER_SITE_OTHER): Payer: Self-pay | Admitting: Family Medicine

## 2020-11-30 ENCOUNTER — Other Ambulatory Visit: Payer: Self-pay

## 2020-11-30 ENCOUNTER — Encounter: Payer: Self-pay | Admitting: Family Medicine

## 2020-11-30 VITALS — BP 112/84 | HR 84 | Ht 63.0 in | Wt 214.0 lb

## 2020-11-30 DIAGNOSIS — N760 Acute vaginitis: Secondary | ICD-10-CM

## 2020-11-30 DIAGNOSIS — B9689 Other specified bacterial agents as the cause of diseases classified elsewhere: Secondary | ICD-10-CM

## 2020-11-30 DIAGNOSIS — N910 Primary amenorrhea: Secondary | ICD-10-CM

## 2020-11-30 MED ORDER — METRONIDAZOLE 500 MG PO TABS: 500.0000 mg | ORAL_TABLET | Freq: Two times a day (BID) | ORAL | 0 refills | Status: AC

## 2020-11-30 NOTE — Progress Notes (Signed)
Date:  11/30/2020   Name:  Vicki Hill   DOB:  August 19, 1995   MRN:  [id number]   Chief Complaint: New Patient (Initial Visit) and Establish Care  Patient is a 25 year old female who presents for a comprehensive physical exam. The patient reports the following problems: period due. Health maintenance has been reviewed up to date.     Lab Results  Component Value Date   CREATININE 0.64 03/20/2017   BUN 10 03/20/2017   NA 137 03/20/2017   K 3.5 03/20/2017   CL 103 03/20/2017   CO2 24 03/20/2017   No results found for: CHOL, HDL, LDLCALC, LDLDIRECT, TRIG, CHOLHDL No results found for: TSH No results found for: HGBA1C Lab Results  Component Value Date   WBC 7.9 03/20/2017   HGB 13.3 03/20/2017   HCT 40.3 03/20/2017   MCV 87.3 03/20/2017   PLT 391 03/20/2017   Lab Results  Component Value Date   ALT 16 03/20/2017   AST 24 03/20/2017   ALKPHOS 44 03/20/2017   BILITOT 0.6 03/20/2017   No components found for: VITD  Review of Systems  Constitutional:  Negative for chills and fever.  HENT:  Negative for drooling, ear discharge, ear pain and sore throat.   Respiratory:  Negative for cough, shortness of breath and wheezing.   Cardiovascular:  Negative for chest pain, palpitations and leg swelling.  Gastrointestinal:  Negative for abdominal pain, blood in stool, constipation, diarrhea and nausea.  Endocrine: Negative for polydipsia.  Genitourinary:  Negative for dysuria, frequency, hematuria and urgency.  Musculoskeletal:  Negative for back pain, myalgias and neck pain.  Skin:  Negative for rash.  Allergic/Immunologic: Negative for environmental allergies.  Neurological:  Negative for dizziness and headaches.  Hematological:  Does not bruise/bleed easily.  Psychiatric/Behavioral:  Negative for suicidal ideas. The patient is not nervous/anxious.    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 No pertinent surgical history.  Social History   Tobacco Use   Smoking status: Never   Smokeless tobacco: Never  Vaping Use   Vaping Use: Never used  Substance Use Topics   Alcohol use: Yes   Drug use: Never     Medication list has been reviewed and updated.  Current Meds  Medication Sig   rizatriptan (MAXALT) 10 MG tablet Take 1 tablet (10 mg total) by mouth as needed for migraine. May repeat in 2 hours if needed    No flowsheet data found.  No flowsheet data found.  BP Readings from Last 3 Encounters:  11/30/20 112/84  11/07/20 133/86  08/25/20 116/90    Physical Exam Vitals and nursing note reviewed.  Constitutional:      General: She is not in acute distress.    Appearance: She is not diaphoretic.  HENT:     Head: Normocephalic and atraumatic.     Right Ear: Tympanic membrane, ear canal and external ear normal. There is no impacted cerumen.     Left Ear: Tympanic membrane, ear canal and external ear normal. There is no impacted cerumen.     Nose: Nose normal. No congestion or rhinorrhea.  Eyes:     General:        Right eye: No discharge.        Left eye: No discharge.     Conjunctiva/sclera: Conjunctivae  normal.     Pupils: Pupils are equal, round, and reactive to light.  Neck:     Thyroid: No thyromegaly.     Vascular: No JVD.  Cardiovascular:     Rate and Rhythm: Normal rate and regular rhythm.     Heart sounds: Normal heart sounds. No murmur heard.   No friction rub. No gallop.  Pulmonary:     Effort: Pulmonary effort is normal.     Breath sounds: Normal breath sounds.  Abdominal:     General: Bowel sounds are normal.     Palpations: Abdomen is soft. There is no mass.     Tenderness: There is no abdominal tenderness. There is no guarding.  Musculoskeletal:        General: Normal range of motion.     Cervical back: Normal range of motion and neck supple.   Lymphadenopathy:     Cervical: No cervical adenopathy.  Skin:    General: Skin is warm and dry.  Neurological:     Mental Status: She is alert.     Deep Tendon Reflexes: Reflexes are normal and symmetric.    Wt Readings from Last 3 Encounters:  11/30/20 214 lb (97.1 kg)  11/07/20 203 lb (92.1 kg)  11/25/19 210 lb (95.3 kg)    BP 112/84 (BP Location: Left Arm, Patient Position: Sitting, Cuff Size: Normal)   Pulse 84   Ht 5\' 3"  (1.6 m)   Wt 214 lb (97.1 kg)   LMP 11/02/2020 (Exact Date)   BMI 37.91 kg/m   Assessment and Plan: Patient establishing care with new physician. 1. Delayed period Patient at the edge of July of her next menstrual period.  She is currently not on birth control.  Pregnancy test was done and it was negative but I have encouraged patient to continue to watch for the her period for the next 1 to 2 weeks.  We have referred to GYN for screening for Pap smears and evaluation for repetitive bacterial vaginosis. - Ambulatory referral to Gynecology  2. Bacterial vaginosis Patient with history of bacterial vaginosis desires to resume a dosing of metronidazole but since we are waiting to have her period we will hold on refilling her metronidazole prescription until she has her period and afterwards she may start with metronidazole 500 mg twice a day for 7 days. - metroNIDAZOLE (FLAGYL) 500 MG tablet; Take 1 tablet (500 mg total) by mouth 2 (two) times daily for 7 days.  Dispense: 14 tablet; Refill: 0

## 2020-11-30 NOTE — Patient Instructions (Signed)
Bacterial Vaginosis °Bacterial vaginosis is an infection that occurs when the normal balance of bacteria in the vagina changes. This change is caused by an overgrowth of certain bacteria in the vagina. Bacterial vaginosis is the most common vaginal infection among females aged 27 to 44 years. °This condition increases the risk of sexually transmitted infections (STIs). Treatment can help reduce this risk. Treatment is very important for pregnant women because this condition can cause babies to be born early (prematurely) or at a low birth weight. °What are the causes? °This condition is caused by an increase in harmful bacteria that are normally present in small amounts in the vagina. However, the exact reason this condition develops is not known. °You cannot get bacterial vaginosis from toilet seats, bedding, swimming pools, or contact with objects around you. °What increases the risk? °The following factors may make you more likely to develop this condition: °Having a new sexual partner or multiple sexual partners, or having unprotected sex. °Douching. °Having an intrauterine device (IUD). °Smoking. °Abusing drugs and alcohol. This may lead to riskier sexual behavior. °Taking certain antibiotic medicines. °Being pregnant. °What are the signs or symptoms? °Some women with this condition have no symptoms. Symptoms may include: °Gray or white vaginal discharge. The discharge can be watery or foamy. °A fish-like odor with discharge, especially after sex or during menstruation. °Itching in and around the vagina. °Burning or pain with urination. °How is this diagnosed? °This condition is diagnosed based on: °Your medical history. °A physical exam of the vagina. °Checking a sample of vaginal fluid for harmful bacteria or abnormal cells. °How is this treated? °This condition is treated with antibiotic medicines. These may be given as a pill, a vaginal cream, or a medicine that is put into the vagina (suppository). If the  condition comes back after treatment, a second round of antibiotics may be needed. °Follow these instructions at home: °Medicines °Take or apply over-the-counter and prescription medicines only as told by your health care provider. °Take or apply your antibiotic medicine as told by your health care provider. Do not stop using the antibiotic even if you start to feel better. °General instructions °If you have a female sexual partner, tell her that you have a vaginal infection. She should follow up with her health care provider. If you have a female sexual partner, he does not need treatment. °Avoid sexual activity until you finish treatment. °Drink enough fluid to keep your urine pale yellow. °Keep the area around your vagina and rectum clean. °Wash the area daily with warm water. °Wipe yourself from front to back after using the toilet. °If you are breastfeeding, talk to your health care provider about continuing breastfeeding during treatment. °Keep all follow-up visits. This is important. °How is this prevented? °Self-care °Do not douche. °Wash the outside of your vagina with warm water only. °Wear cotton or cotton-lined underwear. °Avoid wearing tight pants and pantyhose, especially during the summer. °Safe sex °Use protection when having sex. This includes: °Using condoms. °Using dental dams. This is a thin layer of a material made of latex or polyurethane that protects the mouth during oral sex. °Limit the number of sexual partners. To help prevent bacterial vaginosis, it is best to have sex with just one partner (monogamous relationship). °Make sure you and your sexual partner are tested for STIs. °Drugs and alcohol °Do not use any products that contain nicotine or tobacco. These products include cigarettes, chewing tobacco, and vaping devices, such as e-cigarettes. If you need help quitting,   ask your health care provider. °Do not use drugs. °Do not drink alcohol if: °Your health care provider tells you not to  do this. °You are pregnant, may be pregnant, or are planning to become pregnant. °If you drink alcohol: °Limit how much you have to 0-1 drink a day. °Be aware of how much alcohol is in your drink. In the [place] one drink equals one 12 oz bottle of beer (355 mL), one 5 oz glass of wine (148 mL), or one 1½ oz glass of hard liquor (44 mL). °Where to find more information °Centers for Disease Control and Prevention: www.cdc.gov °American Sexual Health Association (ASHA): www.ashastd.org °[place] Department of Health and Human Services, Office on Women's Health: www.womenshealth.gov °Contact a health care provider if: °Your symptoms do not improve, even after treatment. °You have more discharge or pain when urinating. °You have a fever or chills. °You have pain in your abdomen or pelvis. °You have pain during sex. °You have vaginal bleeding between menstrual periods. °Summary °Bacterial vaginosis is a vaginal infection that occurs when the normal balance of bacteria in the vagina changes. It results from an overgrowth of certain bacteria. °This condition increases the risk of sexually transmitted infections (STIs). Getting treated can help reduce this risk. °Treatment is very important for pregnant women because this condition can cause babies to be born early (prematurely) or at low birth weight. °This condition is treated with antibiotic medicines. These may be given as a pill, a vaginal cream, or a medicine that is put into the vagina (suppository). °This information is not intended to replace advice given to you by your health care provider. Make sure you discuss any questions you have with your health care provider. °Document Revised: 06/26/2019 Document Reviewed: 06/26/2019 °Elsevier Patient Education © 2022 [employer] ° °

## 2020-12-08 ENCOUNTER — Ambulatory Visit
Admission: EM | Admit: 2020-12-08 | Discharge: 2020-12-08 | Disposition: A | Discharge status: Home / Self Care | Payer: Self-pay | Attending: Physician Assistant | Admitting: Physician Assistant

## 2020-12-08 ENCOUNTER — Other Ambulatory Visit: Payer: Self-pay

## 2020-12-08 DIAGNOSIS — R509 Fever, unspecified: Secondary | ICD-10-CM

## 2020-12-08 DIAGNOSIS — R051 Acute cough: Secondary | ICD-10-CM

## 2020-12-08 DIAGNOSIS — J111 Influenza due to unidentified influenza virus with other respiratory manifestations: Secondary | ICD-10-CM

## 2020-12-08 MED ORDER — OSELTAMIVIR PHOSPHATE 75 MG PO CAPS: 75.0000 mg | ORAL_CAPSULE | Freq: Two times a day (BID) | ORAL | 0 refills | Status: AC

## 2020-12-08 MED ORDER — PSEUDOEPH-BROMPHEN-DM 30-2-10 MG/5ML PO SYRP: 10.0000 mL | ORAL_SOLUTION | Freq: Four times a day (QID) | ORAL | 0 refills | Status: AC | PRN

## 2020-12-08 NOTE — ED Triage Notes (Signed)
Pt c/o of fever of 102, body aches, coughing that hurts the chest, headache. Been around godson and mom that have flu. Trying nyquil and dayquil and mucinex

## 2020-12-08 NOTE — Discharge Instructions (Addendum)
URI/COLD SYMPTOMS: Your exam today is consistent with a viral illness. Antibiotics are not indicated at this time. Use medications as directed, including cough syrup, nasal saline, and decongestants. Your symptoms should improve over the next few days and resolve within 7-10 days. Increase rest and fluids. F/u if symptoms worsen or predominate such as sore throat, ear pain, productive cough, shortness of breath, or if you develop high fevers or worsening fatigue over the next several days.    You likely have the flu given your exposure and symptoms.  I have printed prescription for Tamiflu and Bromfed-DM.  Since you do not have insurance use the GoodRx app and you can get these medications for cheaper.  If you have trouble using the app, please asked the pharmacist.  You should stay out of work until given fever for greater than 24 hours without Tylenol or Motrin.

## 2020-12-08 NOTE — ED Provider Notes (Signed)
[facility]    CSN: [id number] Arrival date & time: 12/08/20  0913      History   Chief Complaint Chief Complaint  Patient presents with   Sore Throat    HPI Vicki Hill is a 25 y.o. female presenting for fever of 202 degrees, fatigue, body aches, cough, congestion and sore throat.  Patient has been exposed to 2 different people who have the flu.  Believes she may also have the flu.  She has been taking over-the-counter DayQuil and NyQuil and also tried Mucinex but says none of those have helped.  She is not reporting any breathing occultly, vomiting or diarrhea.  No known COVID exposure.  Patient otherwise healthy.  No other complaints.  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  OB History   No obstetric history on file.      Home Medications    Prior to Admission medications   Medication Sig Start Date End Date Taking? Authorizing Provider  brompheniramine-pseudoephedrine-DM 30-2-10 MG/5ML syrup Take 10 mLs by mouth 4 (four) times daily as needed for up to 7 days. 12/08/20 12/15/20 Yes Danton Clap, PA-C  oseltamivir (TAMIFLU) 75 MG capsule Take 1 capsule (75 mg total) by mouth every 12 (twelve) hours for 5 days. 12/08/20 12/13/20 Yes Laurene Footman B, PA-C  rizatriptan (MAXALT) 10 MG tablet Take 1 tablet (10 mg total) by mouth as needed for migraine. May repeat in 2 hours if needed 08/25/20   Margarette Canada, NP    Family History Family History  Problem Relation Age of Onset   Cancer Paternal Grandfather     Social History Social History   Tobacco Use   Smoking status: Never   Smokeless tobacco: Never  Vaping Use   Vaping Use: Never used  Substance Use Topics   Alcohol use: Yes   Drug  use: Never     Allergies   Patient has no known allergies.   Review of Systems Review of Systems  Constitutional:  Positive for fatigue and fever. Negative for chills and diaphoresis.  HENT:  Positive for congestion, rhinorrhea and sore throat. Negative for ear pain, sinus pressure and sinus pain.   Respiratory:  Positive for cough. Negative for shortness of breath.   Cardiovascular:  Positive for chest pain (when coughing).  Gastrointestinal:  Negative for abdominal pain, nausea and vomiting.  Musculoskeletal:  Positive for myalgias. Negative for arthralgias.  Skin:  Negative for rash.  Neurological:  Positive for headaches. Negative for weakness.  Hematological:  Negative for adenopathy.    Physical Exam Triage Vital Signs ED Triage Vitals  Enc Vitals Group     BP 12/08/20 1021 123/84     Pulse Rate 12/08/20 1021 (!) 132     Resp 12/08/20 1021 16     Temp 12/08/20 1021 100.2 F (37.9 C)     Temp Source 12/08/20 1021 Oral     SpO2 12/08/20 1021 97 %     Weight 12/08/20 1020 214 lb 1.1 oz (97.1 kg)     Height 12/08/20 1020 5\' 3"  (1.6 m)     Head Circumference --      Peak Flow --  Pain Score 12/08/20 1020 8     Pain Loc --      Pain Edu? --      Excl. in [place]? --    No data found.  Updated Vital Signs BP 123/84 (BP Location: Left Arm)   Pulse (!) 132   Temp 100.2 F (37.9 C) (Oral)   Resp 16   Ht 5\' 3"  (1.6 m)   Wt 214 lb 1.1 oz (97.1 kg)   LMP 12/01/2020   SpO2 97%   BMI 37.92 kg/m       Physical Exam Vitals and nursing note reviewed.  Constitutional:      General: She is not in acute distress.    Appearance: Normal appearance. She is well-developed. She is ill-appearing. She is not toxic-appearing.  HENT:     Head: Normocephalic and atraumatic.     Nose: Congestion and rhinorrhea present.     Mouth/Throat:     Mouth: Mucous membranes are moist.     Pharynx: Oropharynx is clear. Posterior oropharyngeal erythema present.  Eyes:     General: No  scleral icterus.       Right eye: No discharge.        Left eye: No discharge.     Conjunctiva/sclera: Conjunctivae normal.  Cardiovascular:     Rate and Rhythm: Regular rhythm. Tachycardia present.     Heart sounds: Normal heart sounds.  Pulmonary:     Effort: Pulmonary effort is normal. No respiratory distress.     Breath sounds: Normal breath sounds.  Musculoskeletal:     Cervical back: Neck supple.  Skin:    General: Skin is dry.  Neurological:     General: No focal deficit present.     Mental Status: She is alert. Mental status is at baseline.     Motor: No weakness.     Gait: Gait normal.  Psychiatric:        Mood and Affect: Mood normal.        Behavior: Behavior normal.        Thought Content: Thought content normal.     UC Treatments / Results  Labs (all labs ordered are listed, but only abnormal results are displayed) Labs Reviewed - No data to display  EKG   Radiology No results found.  Procedures Procedures (including critical care time)  Medications Ordered in UC Medications - No data to display  Initial Impression / Assessment and Plan / UC Course  I have reviewed the triage vital signs and the nursing notes.  Pertinent labs & imaging results that were available during my care of the patient were reviewed by me and considered in my medical decision making (see chart for details).  25 year old female presenting for 2-day history of fever, fatigue, cough, congestion, body aches and sore throat.  Exposed to influenza by 2 different people.  Today temp is elevated at 100.2 degrees and pulse elevated at 132 bpm.  She is ill-appearing but nontoxic.  Nasal congestion and clear rhinorrhea on exam, mild posterior pharyngeal erythema.  Chest clear to auscultation.  Advised patient she likely does have a flu given her exposures and symptoms.  I did offer testing versus just going ahead and treating her.  She would like to hold off on the test.  We will treat.   Printed prescriptions for Tamiflu and Bromfed-DM.  Reviewed supportive care, Tylenol Motrin for fever.  Increase rest and fluids.  Work note given.  Follow-up as needed.   Final Clinical Impressions(s) /  UC Diagnoses   Final diagnoses:  Influenza  Acute cough  Fever, unspecified     Discharge Instructions      URI/COLD SYMPTOMS: Your exam today is consistent with a viral illness. Antibiotics are not indicated at this time. Use medications as directed, including cough syrup, nasal saline, and decongestants. Your symptoms should improve over the next few days and resolve within 7-10 days. Increase rest and fluids. F/u if symptoms worsen or predominate such as sore throat, ear pain, productive cough, shortness of breath, or if you develop high fevers or worsening fatigue over the next several days.    You likely have the flu given your exposure and symptoms.  I have printed prescription for Tamiflu and Bromfed-DM.  Since you do not have insurance use the GoodRx app and you can get these medications for cheaper.  If you have trouble using the app, please asked the pharmacist.  You should stay out of work until given fever for greater than 24 hours without Tylenol or Motrin.      ED Prescriptions     Medication Sig Dispense Auth. Provider   oseltamivir (TAMIFLU) 75 MG capsule Take 1 capsule (75 mg total) by mouth every 12 (twelve) hours for 5 days. 10 capsule Laurene Footman B, PA-C   brompheniramine-pseudoephedrine-DM 30-2-10 MG/5ML syrup Take 10 mLs by mouth 4 (four) times daily as needed for up to 7 days. 150 mL Danton Clap, PA-C      PDMP not reviewed this encounter.   Danton Clap, PA-C 12/08/20 1120

## 2020-12-22 ENCOUNTER — Encounter: Payer: Self-pay | Admitting: Family Medicine

## 2020-12-22 ENCOUNTER — Telehealth: Payer: Self-pay

## 2020-12-22 NOTE — Telephone Encounter (Signed)
Patient requests appt here at [facility] today. Unable to be seen by PCP. Cough is causing SOB. No respiratory distress.

## 2020-12-23 ENCOUNTER — Ambulatory Visit: Payer: 59 | Admitting: Family Medicine

## 2020-12-24 ENCOUNTER — Other Ambulatory Visit: Payer: Self-pay

## 2020-12-24 ENCOUNTER — Ambulatory Visit
Admission: RE | Admit: 2020-12-24 | Discharge: 2020-12-24 | Disposition: A | Discharge status: Home / Self Care | Payer: 59 | Source: Ambulatory Visit | Attending: Emergency Medicine | Admitting: Emergency Medicine

## 2020-12-24 VITALS — BP 142/97 | HR 102 | Temp 98.7°F | Resp 14 | Ht 63.0 in | Wt 205.0 lb

## 2020-12-24 DIAGNOSIS — J0100 Acute maxillary sinusitis, unspecified: Secondary | ICD-10-CM

## 2020-12-24 MED ORDER — AMOXICILLIN-POT CLAVULANATE 875-125 MG PO TABS: 1.0000 | ORAL_TABLET | Freq: Two times a day (BID) | ORAL | 0 refills | Status: AC

## 2020-12-24 MED ORDER — BENZONATATE 100 MG PO CAPS: 200.0000 mg | ORAL_CAPSULE | Freq: Three times a day (TID) | ORAL | 0 refills | Status: DC

## 2020-12-24 MED ORDER — IPRATROPIUM BROMIDE 0.06 % NA SOLN: 2.0000 | Freq: Four times a day (QID) | NASAL | 12 refills | Status: DC

## 2020-12-24 MED ORDER — PROMETHAZINE-DM 6.25-15 MG/5ML PO SYRP: 5.0000 mL | ORAL_SOLUTION | Freq: Four times a day (QID) | ORAL | 0 refills | Status: DC | PRN

## 2020-12-24 NOTE — Discharge Instructions (Signed)
The Augmentin twice daily with food for 10 days for treatment of your sinusitis.  Perform sinus irrigation 2-3 times a day with a [employer] sinus rinse kit and distilled water.  Do not use tap water.  You can use plain over-the-counter Mucinex every 6 hours to break up the stickiness of the mucus so your body can clear it.  Increase your oral fluid intake to thin out your mucus so that is also able for your body to clear more easily.  Take an over-the-counter probiotic, such as Culturelle-align-activia, 1 hour after each dose of antibiotic to prevent diarrhea.  Use the Atrovent nasal spray, 2 squirts in each nostril every 6 hours, as needed for runny nose and postnasal drip.  Use the Tessalon Perles every 8 hours during the day.  Take them with a small sip of water.  They may give you some numbness to the base of your tongue or a metallic taste in your mouth, this is normal.  Use the Promethazine DM cough syrup at bedtime for cough and congestion.  It will make you drowsy so do not take it during the day.  Return for reevaluation or see your primary care provider for any new or worsening symptoms.   If you develop any new or worsening symptoms return for reevaluation or see your primary care provider.

## 2020-12-24 NOTE — ED Triage Notes (Signed)
Patient states that she was treated for the Flu on 12/08/20.  Patient c/o ongoing cough and chest congestion and nasal congestion.  Patient denies recent fevers.

## 2020-12-24 NOTE — ED Provider Notes (Signed)
°[facility] ° ° ° °CSN: [id number] °Arrival date & time: 12/24/20  1700 ° ° °  ° °History   °Chief Complaint °Chief Complaint  °Patient presents with  ° Cough  ° ° °HPI °Vicki Hill is a 25 y.o. female.  ° °HPI ° °25-year-old female here for evaluation of respiratory complaints. ° °Patient reports that she was diagnosed with the flu on 12/08/2020 and since then she is continue to have an ongoing nasal congestion with thick green nasal discharge, ear pressure, sore throat, and a productive cough for green sputum.  The cough is mostly productive bursting in the morning and at night.  She does not continue to run fevers.  She denies any sinus pain or pressure and she denies shortness of breath. 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 No pertinent surgical history. ° °OB History   °No obstetric history on file. °  ° ° ° °Home Medications   ° °Prior to Admission medications   °Medication Sig Start Date End Date Taking? Authorizing Provider  °amoxicillin-clavulanate (AUGMENTIN) 875-125 MG tablet Take 1 tablet by mouth every 12 (twelve) hours for 10 days. 12/24/20 01/03/21 Yes Ryan, Jeremy, NP  °benzonatate (TESSALON) 100 MG capsule Take 2 capsules (200 mg total) by mouth every 8 (eight) hours. 12/24/20  Yes Ryan, Jeremy, NP  °ipratropium (ATROVENT) 0.06 % nasal spray Place 2 sprays into both nostrils 4 (four) times daily. 12/24/20  Yes Ryan, Jeremy, NP  °promethazine-dextromethorphan (PROMETHAZINE-DM) 6.25-15 MG/5ML syrup Take 5 mLs by mouth 4 (four) times daily as needed. 12/24/20  Yes Ryan, Jeremy, NP  °rizatriptan (MAXALT) 10 MG tablet Take 1 tablet (10 mg total) by mouth as needed for migraine. May repeat in 2 hours if needed 08/25/20   Ryan, Jeremy, NP   ° ° °Family History °Family History  °Problem Relation Age of Onset  ° Cancer Paternal Grandfather   ° ° °Social History °Social History  ° °Tobacco Use  ° Smoking status: Never  ° Smokeless tobacco: Never  °Vaping Use  ° Vaping Use: Never used  °Substance Use Topics  ° Alcohol use: Yes  ° Drug use: Never  ° ° ° °Allergies   °Patient has no known allergies. ° ° °Review of Systems °Review of Systems  °Constitutional:  Negative for activity change, appetite change and fever.  °HENT:  Positive for congestion, rhinorrhea and sore throat. Negative for ear pain and sinus pain.   °Respiratory:  Positive for cough. Negative for shortness of breath and wheezing.   °Gastrointestinal:  Negative for diarrhea, nausea and vomiting.  °Skin:  Negative for rash.  °Hematological: Negative.   ° ° °Physical Exam °Triage Vital Signs °ED Triage Vitals  °Enc Vitals Group  °   BP 12/24/20 1709 (!) 142/97  °   Pulse Rate 12/24/20 1709 (!) 102  °   Resp 12/24/20 1709 14  °   Temp 12/24/20 1709 98.7 °F (37.1 °C)  °   Temp Source 12/24/20 1709 Oral  °   SpO2 12/24/20 1709 100 %  °   Weight 12/24/20 1707 205 lb (93 kg)  °   Height 12/24/20 1707 5' 3" (1.6 m)  °   Head Circumference --   °     Peak Flow --      Pain Score 12/24/20 1706 4     Pain Loc --      Pain Edu? --      Excl. in [place]? --    No data found.  Updated Vital Signs BP (!) 142/97 (BP Location: Left Arm)    Pulse (!) 102    Temp 98.7 F (37.1 C) (Oral)    Resp 14    Ht 5' 3" (1.6 m)    Wt 205 lb (93 kg)    LMP 12/01/2020    SpO2 100%    BMI 36.31 kg/m   Visual Acuity Right Eye Distance:   Left Eye Distance:   Bilateral Distance:    Right Eye Near:   Left Eye Near:    Bilateral Near:     Physical Exam Vitals and nursing note reviewed.  Constitutional:      General: She is not in acute distress.    Appearance: Normal appearance. She is not ill-appearing.  HENT:     Head: Normocephalic and atraumatic.     Right Ear: Tympanic membrane, ear canal and external  ear normal. There is no impacted cerumen.     Left Ear: Tympanic membrane, ear canal and external ear normal. There is no impacted cerumen.     Nose: Congestion and rhinorrhea present.     Mouth/Throat:     Mouth: Mucous membranes are moist.     Pharynx: Oropharynx is clear. Posterior oropharyngeal erythema present.  Cardiovascular:     Rate and Rhythm: Normal rate and regular rhythm.     Pulses: Normal pulses.     Heart sounds: Normal heart sounds. No murmur heard.   No gallop.  Pulmonary:     Effort: Pulmonary effort is normal.     Breath sounds: Normal breath sounds. No wheezing, rhonchi or rales.  Musculoskeletal:     Cervical back: Normal range of motion and neck supple.  Lymphadenopathy:     Cervical: No cervical adenopathy.  Skin:    General: Skin is warm and dry.     Capillary Refill: Capillary refill takes less than 2 seconds.     Findings: No erythema or rash.  Neurological:     General: No focal deficit present.     Mental Status: She is alert and oriented to person, place, and time.  Psychiatric:        Mood and Affect: Mood normal.        Behavior: Behavior normal.        Thought Content: Thought content normal.        Judgment: Judgment normal.     UC Treatments / Results  Labs (all labs ordered are listed, but only abnormal results are displayed) Labs Reviewed - No data to display  EKG   Radiology No results found.  Procedures Procedures (including critical care time)  Medications Ordered in UC Medications - No data to display  Initial Impression / Assessment and Plan / UC Course  I have reviewed the triage vital signs and the nursing notes.  Pertinent labs & imaging results that were available during my care of the patient were reviewed by me and considered in my medical decision making (see chart for details).  Patient is a nontoxic-appearing 25 year old female here for evaluation of ongoing upper respiratory congestion with purulent nasal  discharge for the past 2 weeks since being diagnosed with influenza.  She is also getting to have a cough that is mostly present in  the morning and at night but not as much throughout the day.  Both her sputum production and discharge from her nose are green in color.  She does not continue to run fevers.  She denies any sinus pain or pressure.  On physical exam she has pearly-gray tympanic membranes bilaterally with normal light reflex and clear external auditory canals.  Nasal mucosa is erythematous and edematous with purulent discharge in both nares.  Patient does have tenderness to percussion of bilateral maxillary sinuses.  Oropharyngeal exam reveals mild posterior oropharyngeal erythema with green postnasal drip.  No cervical lymphadenopathy appreciated exam.  Cardiopulmonary exam reveals clear lung sounds in all fields.  Patient exam is consistent with maxillary sinusitis, which is most likely secondary to her previous influenza infection, and I will treat her with antibiotics.  I have prescribed Augmentin twice daily for 10 days and have encouraged patient to continue performing sinus irrigation.  I will also give Atrovent nasal spray to help with nasal congestion, Tessalon Perles help with cough during the day, and promethazine cough syrup to help with cough at nighttime. ° ° °Final Clinical Impressions(s) / UC Diagnoses  ° °Final diagnoses:  °Acute non-recurrent maxillary sinusitis  ° ° ° °Discharge Instructions   ° °  °The Augmentin twice daily with food for 10 days for treatment of your sinusitis. ° °Perform sinus irrigation 2-3 times a day with a [employer] sinus rinse kit and distilled water.  Do not use tap water. ° °You can use plain over-the-counter Mucinex every 6 hours to break up the stickiness of the mucus so your body can clear it. ° °Increase your oral fluid intake to thin out your mucus so that is also able for your body to clear more easily. ° °Take an over-the-counter probiotic, such as  Culturelle-align-activia, 1 hour after each dose of antibiotic to prevent diarrhea. ° °Use the Atrovent nasal spray, 2 squirts in each nostril every 6 hours, as needed for runny nose and postnasal drip. ° °Use the Tessalon Perles every 8 hours during the day.  Take them with a small sip of water.  They may give you some numbness to the base of your tongue or a metallic taste in your mouth, this is normal. ° °Use the Promethazine DM cough syrup at bedtime for cough and congestion.  It will make you drowsy so do not take it during the day. ° °Return for reevaluation or see your primary care provider for any new or worsening symptoms.  ° °If you develop any new or worsening symptoms return for reevaluation or see your primary care provider.  ° ° ° ° °ED Prescriptions   ° ° Medication Sig Dispense Auth. Provider  ° amoxicillin-clavulanate (AUGMENTIN) 875-125 MG tablet Take 1 tablet by mouth every 12 (twelve) hours for 10 days. 20 tablet Ryan, Jeremy, NP  ° ipratropium (ATROVENT) 0.06 % nasal spray Place 2 sprays into both nostrils 4 (four) times daily. 15 mL Ryan, Jeremy, NP  ° benzonatate (TESSALON) 100 MG capsule Take 2 capsules (200 mg total) by mouth every 8 (eight) hours. 21 capsule Ryan, Jeremy, NP  ° promethazine-dextromethorphan (PROMETHAZINE-DM) 6.25-15 MG/5ML syrup Take 5 mLs by mouth 4 (four) times daily as needed. 118 mL Ryan, Jeremy, NP  ° °  ° °PDMP not reviewed this encounter. °  °Ryan, Jeremy, NP °12/24/20 1744 ° °

## 2020-12-30 ENCOUNTER — Encounter: Payer: Self-pay | Admitting: Advanced Practice Midwife

## 2021-01-07 ENCOUNTER — Encounter: Payer: Self-pay | Admitting: Advanced Practice Midwife

## 2021-01-12 ENCOUNTER — Encounter: Payer: Self-pay | Admitting: Advanced Practice Midwife

## 2021-01-12 ENCOUNTER — Ambulatory Visit (INDEPENDENT_AMBULATORY_CARE_PROVIDER_SITE_OTHER): Payer: 59 | Admitting: Advanced Practice Midwife

## 2021-01-12 ENCOUNTER — Other Ambulatory Visit (HOSPITAL_COMMUNITY)
Admission: RE | Admit: 2021-01-12 | Discharge: 2021-01-12 | Disposition: A | Discharge status: Home / Self Care | Payer: 59 | Source: Ambulatory Visit | Attending: Advanced Practice Midwife | Admitting: Advanced Practice Midwife

## 2021-01-12 ENCOUNTER — Other Ambulatory Visit: Payer: Self-pay

## 2021-01-12 VITALS — BP 120/80 | Ht 64.0 in | Wt 210.0 lb

## 2021-01-12 DIAGNOSIS — Z8742 Personal history of other diseases of the female genital tract: Secondary | ICD-10-CM

## 2021-01-12 DIAGNOSIS — Z113 Encounter for screening for infections with a predominantly sexual mode of transmission: Secondary | ICD-10-CM | POA: Diagnosis present

## 2021-01-12 DIAGNOSIS — Z124 Encounter for screening for malignant neoplasm of cervix: Secondary | ICD-10-CM

## 2021-01-12 DIAGNOSIS — B3731 Acute candidiasis of vulva and vagina: Secondary | ICD-10-CM | POA: Insufficient documentation

## 2021-01-12 LAB — HM PAP SMEAR

## 2021-01-12 NOTE — Patient Instructions (Signed)

## 2021-01-12 NOTE — Progress Notes (Signed)
Patient ID: Vicki Hill, female   DOB: [birth year], 26 y.o.   MRN: [id number]  Reason for Consult: Gynecologic Exam   Referred by Juline Patch, MD  Subjective:  HPI:  Vicki Hill is a 26 y.o. female being seen for cervical cancer screening and vaginitis/STD screening. She mentions a rash in her groin that appeared recently when she was taking a number of medications for flu/respiratory/sinus infection including Augmentin. Her throat felt tight and she developed the itchy rash and she stopped taking the antibiotic. She does have recurring vaginitis without current symptoms. We discussed preventative and comfort measures. Also discussed screening rationale for cervical cancer screening. She reports last PAP may have been about 6 years ago at [facility]. She usually uses condoms and is not currently sexually active.  Past Medical History:  Diagnosis Date   BV (bacterial vaginosis)    Recurrent   Migraine    Family History  Problem Relation Age of Onset   Cancer Paternal Grandfather    History reviewed. No pertinent surgical history.  Short Social History:  Social History   Tobacco Use   Smoking status: Never   Smokeless tobacco: Never  Substance Use Topics   Alcohol use: Yes    No Known Allergies  Current Outpatient Medications  Medication Sig Dispense Refill   benzonatate (TESSALON) 100 MG capsule Take 2 capsules (200 mg total) by mouth every 8 (eight) hours. (Patient not taking: Reported on 01/12/2021) 21 capsule 0   ipratropium (ATROVENT) 0.06 % nasal spray Place 2 sprays into both nostrils 4 (four) times daily. (Patient not taking: Reported on 01/12/2021) 15 mL 12   promethazine-dextromethorphan (PROMETHAZINE-DM) 6.25-15 MG/5ML syrup Take 5 mLs by mouth 4 (four) times daily as needed. (Patient not taking: Reported on 01/12/2021) 118 mL 0   rizatriptan (MAXALT) 10 MG tablet Take 1 tablet (10 mg total) by mouth as needed for migraine. May repeat  in 2 hours if needed (Patient not taking: Reported on 01/12/2021) 10 tablet 0   No current facility-administered medications for this visit.   Review of Systems  Constitutional:  Negative for chills and fever.  HENT:  Negative for congestion, ear discharge, ear pain, hearing loss, sinus pain and sore throat.   Eyes:  Negative for blurred vision and double vision.  Respiratory:  Negative for cough, shortness of breath and wheezing.   Cardiovascular:  Negative for chest pain, palpitations and leg swelling.  Gastrointestinal:  Negative for abdominal pain, blood in stool, constipation, diarrhea, heartburn, melena, nausea and vomiting.  Genitourinary:  Negative for dysuria, flank pain, frequency, hematuria and urgency.  Musculoskeletal:  Negative for back pain, joint pain and myalgias.  Skin:  Positive for rash. Negative for itching.  Neurological:  Negative for dizziness, tingling, tremors, sensory change, speech change, focal weakness, seizures, loss of consciousness, weakness and headaches.  Endo/Heme/Allergies:  Negative for environmental allergies. Does not bruise/bleed easily.  Psychiatric/Behavioral:  Negative for depression, hallucinations, memory loss, substance abuse and suicidal ideas. The patient is not nervous/anxious and does not have insomnia.        Objective:  Objective   Vitals:   01/12/21 0906  BP: 120/80  Weight: 210 lb (95.3 kg)  Height: 5\' 4"  (1.626 m)   Body mass index is 36.05 kg/m. Constitutional: Well nourished, well developed female in no acute distress.  HEENT: normal Skin: Warm and dry.  Cardiovascular: Regular rate and rhythm.   Extremity:  no edema   Respiratory: Clear to auscultation bilateral.  Normal respiratory effort Neuro: DTRs 2+, Cranial nerves grossly intact Psych: Alert and Oriented x3. No memory deficits. Normal mood and affect.    Pelvic exam: is not limited by body habitus EGBUS: within normal limits Vagina: within normal limits and with  normal mucosa, particulate white discharge Cervix: grossly normal appearance   Assessment/Plan:     26 y.o. female cervical cancer screening, STD/vaginitis screening  PAP smear Aptima: vaginitis/STD Follow up as needed after labs result Vaginitis AVS given   [facility] 01/12/2021, 9:59 AM

## 2021-01-14 ENCOUNTER — Other Ambulatory Visit: Payer: Self-pay | Admitting: Advanced Practice Midwife

## 2021-01-14 DIAGNOSIS — B3731 Acute candidiasis of vulva and vagina: Secondary | ICD-10-CM

## 2021-01-14 LAB — CERVICOVAGINAL ANCILLARY ONLY
Bacterial Vaginitis (gardnerella): NEGATIVE
Candida Glabrata: NEGATIVE
Candida Vaginitis: POSITIVE — AB
Chlamydia: NEGATIVE
Comment: NEGATIVE
Comment: NEGATIVE
Comment: NEGATIVE
Comment: NEGATIVE
Comment: NEGATIVE
Comment: NORMAL
Neisseria Gonorrhea: NEGATIVE
Trichomonas: NEGATIVE

## 2021-01-14 MED ORDER — FLUCONAZOLE 150 MG PO TABS: 150.0000 mg | ORAL_TABLET | Freq: Once | ORAL | 1 refills | Status: AC

## 2021-01-14 NOTE — Progress Notes (Signed)
Diflucan Rx sent to treat yeast infection. Message to patient.

## 2021-01-15 LAB — CYTOLOGY - PAP: Diagnosis: NEGATIVE

## 2021-01-25 ENCOUNTER — Ambulatory Visit: Payer: 59 | Admitting: Family Medicine

## 2021-03-22 ENCOUNTER — Encounter: Payer: Self-pay | Admitting: Advanced Practice Midwife

## 2021-03-22 ENCOUNTER — Telehealth: Payer: Self-pay | Admitting: Advanced Practice Midwife

## 2021-03-22 NOTE — Telephone Encounter (Signed)
Called pt to schedule appt.  Left message for pt to call back.  Pt had called the Nurse line.   ?

## 2021-03-22 NOTE — Telephone Encounter (Signed)
Called pt to schedule appt.  Left message for her to call the office.   ?

## 2021-04-11 ENCOUNTER — Telehealth: Payer: 59 | Admitting: Nurse Practitioner

## 2021-04-11 DIAGNOSIS — N760 Acute vaginitis: Secondary | ICD-10-CM

## 2021-04-11 DIAGNOSIS — B9689 Other specified bacterial agents as the cause of diseases classified elsewhere: Secondary | ICD-10-CM

## 2021-04-12 ENCOUNTER — Other Ambulatory Visit: Payer: Self-pay | Admitting: Advanced Practice Midwife

## 2021-04-12 DIAGNOSIS — B9689 Other specified bacterial agents as the cause of diseases classified elsewhere: Secondary | ICD-10-CM

## 2021-04-12 MED ORDER — METRONIDAZOLE 500 MG PO TABS: 500.0000 mg | ORAL_TABLET | Freq: Two times a day (BID) | ORAL | 0 refills | Status: AC

## 2021-04-12 NOTE — Progress Notes (Signed)
E-Visit for Vaginal Symptoms ? ?We can go ahead and treat you for this round of BV, but after this we recommend you see an OBGYN for dicussion of chronic recurrent vaginitis. There are preventative medications that an OBGYN can manage you with to avoid needing antibiotics so frequently. You do not need a referral to see an OBGYN, you can call anyone that you would like to see and schedule yourself. ? ?Based on what you shared with me it looks like you: May have a vaginosis due to bacteria ? ?Vaginosis is an inflammation of the vagina that can result in discharge, itching and pain. The cause is usually a change in the normal balance of vaginal bacteria or an infection. Vaginosis can also result from reduced estrogen levels after menopause. ? ?The most common causes of vaginosis are: ? ? Bacterial vaginosis which results from an overgrowth of one on several organisms that are normally present in your vagina. ? ? Yeast infections which are caused by a naturally occurring fungus called candida. ? ? Vaginal atrophy (atrophic vaginosis) which results from the thinning of the vagina from reduced estrogen levels after menopause. ? ? Trichomoniasis which is caused by a parasite and is commonly transmitted by sexual intercourse. ? ?Factors that increase your risk of developing vaginosis include: ?Medications, such as antibiotics and steroids ?Uncontrolled diabetes ?Use of hygiene products such as bubble bath, vaginal spray or vaginal deodorant ?Douching ?Wearing damp or tight-fitting clothing ?Using an intrauterine device (IUD) for birth control ?Hormonal changes, such as those associated with pregnancy, birth control pills or menopause ?Sexual activity ?Having a sexually transmitted infection ? ?Your treatment plan is Metronidazole or Flagyl '500mg'$  twice a day for 7 days.  I have electronically sent this prescription into the pharmacy that you have chosen. ? ?Be sure to take all of the medication as directed. Stop taking any  medication if you develop a rash, tongue swelling or shortness of breath. Mothers who are breast feeding should consider pumping and discarding their breast milk while on these antibiotics. However, there is no consensus that infant exposure at these doses would be harmful.  ?Remember that medication creams can weaken latex condoms. ?. ? ? ?HOME CARE: ? ?Good hygiene may prevent some types of vaginosis from recurring and may relieve some symptoms: ? ?Avoid baths, hot tubs and whirlpool spas. Rinse soap from your outer genital area after a shower, and dry the area well to prevent irritation. Don't use scented or harsh soaps, such as those with deodorant or antibacterial action. ?Avoid irritants. These include scented tampons and pads. ?Wipe from front to back after using the toilet. Doing so avoids spreading fecal bacteria to your vagina. ? ?Other things that may help prevent vaginosis include: ? ?Don't douche. Your vagina doesn't require cleansing other than normal bathing. Repetitive douching disrupts the normal organisms that reside in the vagina and can actually increase your risk of vaginal infection. Douching won't clear up a vaginal infection. ?Use a latex condom. Both female and female latex condoms may help you avoid infections spread by sexual contact. ?Wear cotton underwear. Also wear pantyhose with a cotton crotch. If you feel comfortable without it, skip wearing underwear to bed. Yeast thrives in moist environments ?Your symptoms should improve in the next day or two. ? ?GET HELP RIGHT AWAY IF: ? ?You have pain in your lower abdomen ( pelvic area or over your ovaries) ?You develop nausea or vomiting ?You develop a fever ?Your discharge changes or worsens ?You have  persistent pain with intercourse ?You develop shortness of breath, a rapid pulse, or you faint. ? ?These symptoms could be signs of problems or infections that need to be evaluated by a medical provider now. ? ?MAKE SURE YOU  ? ?Understand these  instructions. ?Will watch your condition. ?Will get help right away if you are not doing well or get worse. ? ?Thank you for choosing an e-visit. ? ?Your e-visit answers were reviewed by a board certified advanced clinical practitioner to complete your personal care plan. Depending upon the condition, your plan could have included both over the counter or prescription medications. ? ?Please review your pharmacy choice. Make sure the pharmacy is open so you can pick up prescription now. If there is a problem, you may contact your provider through [employer] and have the prescription routed to another pharmacy.  Your safety is important to [place]. If you have drug allergies check your prescription carefully.  ? ?For the next 24 hours you can use MyChart to ask questions about today's visit, request a non-urgent call back, or ask for a work or school excuse. ?You will get an email in the next two days asking about your experience. I hope that your e-visit has been valuable and will speed your recovery.  ? ?I spent approximately 5 minutes reviewing the patient's history, current symptoms and coordinating their plan of care today.   ? ?Meds ordered this encounter  ?Medications  ? metroNIDAZOLE (FLAGYL) 500 MG tablet  ?  Sig: Take 1 tablet (500 mg total) by mouth 2 (two) times daily for 7 days.  ?  Dispense:  14 tablet  ?  Refill:  0  ?  ?

## 2021-04-12 NOTE — Progress Notes (Signed)
Rx metronidazole sent to [facility]. Message sent to patient. ?

## 2021-04-18 ENCOUNTER — Encounter: Payer: Self-pay | Admitting: Advanced Practice Midwife

## 2021-04-25 NOTE — Telephone Encounter (Signed)
Contacted patient via phone. Patient is scheduled for 05/02/21 with JEG

## 2021-05-02 ENCOUNTER — Ambulatory Visit: Payer: 59 | Admitting: Advanced Practice Midwife

## 2021-05-02 ENCOUNTER — Encounter: Payer: Self-pay | Admitting: Advanced Practice Midwife

## 2021-05-02 ENCOUNTER — Other Ambulatory Visit (HOSPITAL_COMMUNITY)
Admission: RE | Admit: 2021-05-02 | Discharge: 2021-05-02 | Disposition: A | Discharge status: Home / Self Care | Payer: 59 | Source: Ambulatory Visit | Attending: Advanced Practice Midwife | Admitting: Advanced Practice Midwife

## 2021-05-02 VITALS — BP 120/80 | Ht 65.0 in | Wt 209.0 lb

## 2021-05-02 DIAGNOSIS — Z118 Encounter for screening for other infectious and parasitic diseases: Secondary | ICD-10-CM | POA: Diagnosis not present

## 2021-05-02 DIAGNOSIS — R102 Pelvic and perineal pain: Secondary | ICD-10-CM | POA: Insufficient documentation

## 2021-05-02 DIAGNOSIS — N898 Other specified noninflammatory disorders of vagina: Secondary | ICD-10-CM | POA: Insufficient documentation

## 2021-05-02 LAB — POCT URINALYSIS DIPSTICK
Bilirubin, UA: NEGATIVE
Blood, UA: NEGATIVE
Glucose, UA: NEGATIVE
Ketones, UA: NEGATIVE
Nitrite, UA: NEGATIVE
Protein, UA: NEGATIVE
Spec Grav, UA: 1.01 (ref 1.010–1.025)
Urobilinogen, UA: 0.2 E.U./dL
pH, UA: 5 (ref 5.0–8.0)

## 2021-05-03 LAB — CERVICOVAGINAL ANCILLARY ONLY
Bacterial Vaginitis (gardnerella): NEGATIVE
Candida Glabrata: NEGATIVE
Candida Vaginitis: NEGATIVE
Comment: NEGATIVE
Comment: NEGATIVE
Comment: NEGATIVE

## 2021-05-04 LAB — URINE CULTURE

## 2021-05-06 ENCOUNTER — Encounter: Payer: Self-pay | Admitting: Advanced Practice Midwife

## 2021-05-06 NOTE — Progress Notes (Signed)
? ?Patient ID: Vicki Hill, female   DOB: 1995-01-19, 26 y.o.   MRN: [id number] ? ?Reason for Visit: Pelvic Pain ? ? ?Subjective:  ?Date of Service: 05/02/2021 ? ?HPI: ? ?Vicki Hill is a 26 y.o. female being seen for pelvic pain. She was treated for yeast in January and although the itch she had improved following diflucan, the odor did not go away. She had an E-visit with another provider in early April who prescribed metronidazole which cleared up the odor. She has some persistent low pelvic pain currently. She requests vaginitis screen and UTI screen. She denies urinary symptoms except for pelvic pain. ? ?Past Medical History:  ?Diagnosis Date  ? BV (bacterial vaginosis)   ? Recurrent  ? Migraine   ? ?Family History  ?Problem Relation Age of Onset  ? Cancer Paternal Grandfather   ? ?History reviewed. No pertinent surgical history. ? ?Short Social History:  ?Social History  ? ?Tobacco Use  ? Smoking status: Never  ? Smokeless tobacco: Never  ?Substance Use Topics  ? Alcohol use: Yes  ? ? ?No Known Allergies ? ?Current Outpatient Medications  ?Medication Sig Dispense Refill  ? benzonatate (TESSALON) 100 MG capsule Take 2 capsules (200 mg total) by mouth every 8 (eight) hours. (Patient not taking: Reported on 01/12/2021) 21 capsule 0  ? ipratropium (ATROVENT) 0.06 % nasal spray Place 2 sprays into both nostrils 4 (four) times daily. (Patient not taking: Reported on 01/12/2021) 15 mL 12  ? promethazine-dextromethorphan (PROMETHAZINE-DM) 6.25-15 MG/5ML syrup Take 5 mLs by mouth 4 (four) times daily as needed. (Patient not taking: Reported on 01/12/2021) 118 mL 0  ? rizatriptan (MAXALT) 10 MG tablet Take 1 tablet (10 mg total) by mouth as needed for migraine. May repeat in 2 hours if needed (Patient not taking: Reported on 01/12/2021) 10 tablet 0  ? ?No current facility-administered medications for this visit.  ? ? ?Review of Systems  ?Constitutional:  Negative for chills and fever.  ?HENT:  Negative for  congestion, ear discharge, ear pain, hearing loss, sinus pain and sore throat.   ?Eyes:  Negative for blurred vision and double vision.  ?Respiratory:  Negative for cough, shortness of breath and wheezing.   ?Cardiovascular:  Negative for chest pain, palpitations and leg swelling.  ?Gastrointestinal:  Negative for abdominal pain, blood in stool, constipation, diarrhea, heartburn, melena, nausea and vomiting.  ?Genitourinary:  Negative for dysuria, flank pain, frequency, hematuria and urgency.  ?     Positive for pelvic pain  ?Musculoskeletal:  Negative for back pain, joint pain and myalgias.  ?Skin:  Negative for itching and rash.  ?Neurological:  Negative for dizziness, tingling, tremors, sensory change, speech change, focal weakness, seizures, loss of consciousness, weakness and headaches.  ?Endo/Heme/Allergies:  Negative for environmental allergies. Does not bruise/bleed easily.  ?Psychiatric/Behavioral:  Negative for depression, hallucinations, memory loss, substance abuse and suicidal ideas. The patient is not nervous/anxious and does not have insomnia.   ? ? ?   ?Objective:  ?Objective  ? ?Vitals:  ? 05/02/21 1122  ?BP: 120/80  ?Weight: 209 lb (94.8 kg)  ?Height: '5\' 5"'$  (1.651 m)  ? ?Body mass index is 34.78 kg/m?Marland Kitchen ?Constitutional: Well nourished, well developed female in no acute distress.  ?HEENT: normal ?Skin: Warm and dry.   ?Respiratory: Normal respiratory effort ?Back: no CVAT ?Neuro: DTRs 2+, Cranial nerves grossly intact ?Psych: Alert and Oriented x3. No memory deficits. Normal mood and affect.  ? ?Pelvic exam:  ?is not limited by body  habitus ?EGBUS: within normal limits ?Vagina: within normal limits and with normal mucosa, thin white discharge ? ? ?Data: ? Latest Reference Range & Units 05/02/21 11:40 05/02/21 11:47  ?Candida Vaginitis   Negative  ?Candida Glabrata   Negative  ?Bacterial Vaginitis (gardnerella)   Negative  ?Bilirubin, UA  Negative   ?Glucose Negative  Negative   ?Ketones, UA  Negative    ?Leukocytes,UA Negative  Moderate (2+) !   ?Nitrite, UA  Negative   ?pH, UA 5.0 - 8.0  5.0   ?Protein,UA Negative  Negative   ?Specific Gravity, UA 1.010 - 1.025  1.010   ?Urobilinogen, UA 0.2 or 1.0 E.U./dL 0.2   ?RBC, UA  Negative   ?!: Data is abnormal ? ?Component 4 d ago  ?Urine Culture, Routine Final report   ?Organism ID, Bacteria Comment   ?Comment: Mixed urogenital flora  ?50,000-100,000 colony forming units per mL   ?Resulting Agency [facility]  ?  ? ?  ?Narrative ?Performed by: Maryan Puls ?Performed at:  [facility]  ?[address] [place], [place]  [id number]  ?Lab Director: Rush Farmer MD, Phone:  [phone]  ?  ?Specimen Collected: 05/02/21 11:47 Last Resulted: 05/04/21 05:36  ?  ?  ? ? ?Assessment/Plan:  ? ?26 y.o. with pelvic pain possibly due to high count mixed flora in urine ? ?Aptima: vaginitis ?Urine Culture ?Follow up with patient as needed regarding treatment for UTI. Waiting to hear back from patient regarding symptoms ? ? ?Rod Can CNM ?[facility] ?[facility] ?05/06/2021, 10:18 PM ? ? ?

## 2021-05-09 ENCOUNTER — Other Ambulatory Visit: Payer: Self-pay | Admitting: Advanced Practice Midwife

## 2021-05-09 DIAGNOSIS — N898 Other specified noninflammatory disorders of vagina: Secondary | ICD-10-CM

## 2021-05-09 MED ORDER — FLUCONAZOLE 150 MG PO TABS: 150.0000 mg | ORAL_TABLET | Freq: Once | ORAL | 3 refills | Status: AC

## 2021-05-09 MED ORDER — CLINDAMYCIN HCL 300 MG PO CAPS: 300.0000 mg | ORAL_CAPSULE | Freq: Two times a day (BID) | ORAL | 0 refills | Status: AC

## 2021-05-09 NOTE — Progress Notes (Signed)
Rx sent for clindamycin and diflucan per patient request for persisting vaginal symptoms. She is advised to use boric acid suppositories as preventive following treatment.  ?

## 2021-08-19 ENCOUNTER — Ambulatory Visit
Admission: EM | Admit: 2021-08-19 | Discharge: 2021-08-19 | Disposition: A | Discharge status: Home / Self Care | Payer: 59 | Attending: Emergency Medicine | Admitting: Emergency Medicine

## 2021-08-19 DIAGNOSIS — N898 Other specified noninflammatory disorders of vagina: Secondary | ICD-10-CM

## 2021-08-19 DIAGNOSIS — Z113 Encounter for screening for infections with a predominantly sexual mode of transmission: Secondary | ICD-10-CM | POA: Diagnosis not present

## 2021-08-19 LAB — POCT URINALYSIS DIP (MANUAL ENTRY)
Bilirubin, UA: NEGATIVE
Blood, UA: NEGATIVE
Glucose, UA: NEGATIVE mg/dL
Ketones, POC UA: NEGATIVE mg/dL
Nitrite, UA: NEGATIVE
Protein Ur, POC: NEGATIVE mg/dL
Spec Grav, UA: 1.025 (ref 1.010–1.025)
Urobilinogen, UA: 0.2 E.U./dL
pH, UA: 7 (ref 5.0–8.0)

## 2021-08-19 LAB — POCT URINE PREGNANCY: Preg Test, Ur: NEGATIVE

## 2021-08-19 MED ORDER — FLUCONAZOLE 150 MG PO TABS: ORAL_TABLET | ORAL | 0 refills | Status: DC

## 2021-08-19 MED ORDER — METRONIDAZOLE 500 MG PO TABS
500.0000 mg | ORAL_TABLET | Freq: Two times a day (BID) | ORAL | 0 refills | Status: DC
Start: 2021-08-19 — End: 2021-10-06

## 2021-08-19 NOTE — ED Provider Notes (Signed)
Roderic Palau    CSN: [id number] Arrival date & time: 08/19/21  1746      History   Chief Complaint Chief Complaint  Patient presents with   Vaginal Discharge    i think i have a yeast infection. - Entered by patient    HPI Vicki Hill is a 26 y.o. female.   Patient presents with thick white malodorous vaginal discharge x1.5 weeks.  She states this is similar to previous episodes of bacterial vaginitis.  She denies fever, chills, rash, abdominal pain, dysuria, hematuria, pelvic pain, or other symptoms.  Treatment attempted with boric acid.  Her medical history includes recurrent bacterial vaginosis.   The history is provided by the patient and medical records.    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 No pertinent surgical history.  OB History   No obstetric history on file.      Home Medications    Prior to Admission medications   Medication Sig Start Date End Date Taking? Authorizing Provider  fluconazole (DIFLUCAN) 150 MG tablet Take one tablet as directed. 08/19/21  Yes Sharion Balloon, NP  metroNIDAZOLE (FLAGYL) 500 MG tablet Take 1 tablet (500 mg total) by mouth 2 (two) times daily. 08/19/21  Yes Sharion Balloon, NP  benzonatate (TESSALON) 100 MG capsule Take 2 capsules (200 mg total) by mouth every 8 (eight) hours. Patient not taking: Reported on 01/12/2021 12/24/20   Margarette Canada, NP  ipratropium (ATROVENT) 0.06 % nasal spray Place 2 sprays into both nostrils 4 (four) times daily. Patient not taking: Reported on 01/12/2021 12/24/20   Margarette Canada, NP  promethazine-dextromethorphan (PROMETHAZINE-DM) 6.25-15 MG/5ML syrup Take 5 mLs by mouth 4 (four) times daily as needed. Patient not taking: Reported on 01/12/2021 12/24/20    Margarette Canada, NP  rizatriptan (MAXALT) 10 MG tablet Take 1 tablet (10 mg total) by mouth as needed for migraine. May repeat in 2 hours if needed Patient not taking: Reported on 01/12/2021 08/25/20   Margarette Canada, NP    Family History Family History  Problem Relation Age of Onset   Cancer Paternal Grandfather     Social History Social History   Tobacco Use   Smoking status: Never   Smokeless tobacco: Never  Vaping Use   Vaping Use: Never used  Substance Use Topics   Alcohol use: Yes   Drug use: Never     Allergies   Amoxicillin   Review of Systems Review of Systems  Constitutional:  Negative for chills and fever.  Gastrointestinal:  Negative for abdominal pain, nausea and vomiting.  Genitourinary:  Positive for vaginal discharge. Negative for dysuria, flank pain, hematuria and pelvic pain.  Skin:  Negative for color change and rash.  All other systems reviewed and are negative.    Physical Exam Triage Vital Signs ED Triage Vitals  Enc Vitals Group     BP      Pulse      Resp      Temp      Temp src      SpO2      Weight      Height      Head Circumference      Peak Flow  Pain Score      Pain Loc      Pain Edu?      Excl. in [place]?    No data found.  Updated Vital Signs BP 124/82   Pulse 74   Temp 99 F (37.2 C)   Resp 19   Ht '5\' 4"'$  (1.626 m)   Wt 209 lb (94.8 kg)   LMP 07/28/2021   SpO2 99%   BMI 35.87 kg/m   Visual Acuity Right Eye Distance:   Left Eye Distance:   Bilateral Distance:    Right Eye Near:   Left Eye Near:    Bilateral Near:     Physical Exam Vitals and nursing note reviewed.  Constitutional:      General: She is not in acute distress.    Appearance: Normal appearance. She is well-developed. She is not ill-appearing.  HENT:     Mouth/Throat:     Mouth: Mucous membranes are moist.  Cardiovascular:     Rate and Rhythm: Normal rate and regular rhythm.     Heart sounds: Normal heart sounds.  Pulmonary:     Effort:  Pulmonary effort is normal. No respiratory distress.     Breath sounds: Normal breath sounds.  Abdominal:     General: Bowel sounds are normal.     Palpations: Abdomen is soft.     Tenderness: There is no abdominal tenderness. There is no right CVA tenderness, left CVA tenderness, guarding or rebound.  Musculoskeletal:     Cervical back: Neck supple.  Skin:    General: Skin is warm and dry.  Neurological:     Mental Status: She is alert.  Psychiatric:        Mood and Affect: Mood normal.        Behavior: Behavior normal.      UC Treatments / Results  Labs (all labs ordered are listed, but only abnormal results are displayed) Labs Reviewed  POCT URINALYSIS DIP (MANUAL ENTRY) - Abnormal; Notable for the following components:      Result Value   Clarity, UA cloudy (*)    Leukocytes, UA Small (1+) (*)    All other components within normal limits  POCT URINE PREGNANCY  CERVICOVAGINAL ANCILLARY ONLY    EKG   Radiology No results found.  Procedures Procedures (including critical care time)  Medications Ordered in UC Medications - No data to display  Initial Impression / Assessment and Plan / UC Course  I have reviewed the triage vital signs and the nursing notes.  Pertinent labs & imaging results that were available during my care of the patient were reviewed by me and considered in my medical decision making (see chart for details).  Vaginal discharge, STD screening.  Patient obtained vaginal self swab for testing.  Treating with Flagyl and Diflucan.  Discussed that we will call if test results are positive.  Discussed that she may require additional treatment at that time.  Discussed that sexual partner(s) may also require treatment.  Instructed patient to abstain from sexual activity for at least 7 days.  Instructed her to follow-up with her PCP or gynecologist if her symptoms are not improving.  Patient agrees to plan of care.    Final Clinical Impressions(s) / UC  Diagnoses   Final diagnoses:  Vaginal discharge  Screening for STD (sexually transmitted disease)     Discharge Instructions      Take Flagyl and Diflucan as directed.    Your vaginal tests are pending.  If  your test results are positive, we will call you.  You and your sexual partner(s) may require treatment at that time.  Do not have sexual activity for at least 7 days.    Follow up with your primary care provider or gynecologist if your symptoms are not improving.         ED Prescriptions     Medication Sig Dispense Auth. Provider   metroNIDAZOLE (FLAGYL) 500 MG tablet Take 1 tablet (500 mg total) by mouth 2 (two) times daily. 14 tablet Barkley Boards H, NP   fluconazole (DIFLUCAN) 150 MG tablet Take one tablet as directed. 1 tablet Sharion Balloon, NP      PDMP not reviewed this encounter.   Sharion Balloon, NP 08/19/21 1820

## 2021-08-19 NOTE — ED Triage Notes (Signed)
Patient to urgent care with complaints of vaginal discharge x1.5 weeks, Has been using boric acid without relief. Denies urinary symptoms.

## 2021-08-19 NOTE — Discharge Instructions (Addendum)
Take Flagyl and Diflucan as directed.    Your vaginal tests are pending.  If your test results are positive, we will call you.  You and your sexual partner(s) may require treatment at that time.  Do not have sexual activity for at least 7 days.    Follow up with your primary care provider or gynecologist if your symptoms are not improving.

## 2021-08-22 LAB — CERVICOVAGINAL ANCILLARY ONLY
Bacterial Vaginitis (gardnerella): POSITIVE — AB
Candida Glabrata: NEGATIVE
Candida Vaginitis: NEGATIVE
Chlamydia: NEGATIVE
Comment: NEGATIVE
Comment: NEGATIVE
Comment: NEGATIVE
Comment: NEGATIVE
Comment: NEGATIVE
Comment: NORMAL
Neisseria Gonorrhea: NEGATIVE
Trichomonas: NEGATIVE

## 2021-09-14 ENCOUNTER — Telehealth: Payer: Self-pay | Admitting: Emergency Medicine

## 2021-09-14 MED ORDER — METRONIDAZOLE 500 MG PO TABS: 500.0000 mg | ORAL_TABLET | Freq: Two times a day (BID) | ORAL | 0 refills | Status: DC

## 2021-09-14 MED ORDER — FLUCONAZOLE 150 MG PO TABS
ORAL_TABLET | ORAL | 0 refills | Status: DC
Start: 2021-09-14 — End: 2021-10-06

## 2021-09-14 NOTE — Telephone Encounter (Signed)
Patient called stating she has a reoccurrence of vaginal symptoms. Patient states " I still have the foul smelling discharge with some irritation, I think my period may have thrown me off and I did finish the medications I was prescribed. I have constant issues with BV".  This Probation officer consulted Adriene FNP and was verablly authorized to send patient a second prescription flagyl and diflucan. Patients LMP updated in chart. Patient denies any sexual partners since treatment.   Patient was educated on medication administration and told to refrain from any alcohol use while taking medication and 72 hours after finishing flagyl prescription.

## 2021-09-28 IMAGING — CR DG ANKLE COMPLETE 3+V*L*
3 series · 3 of 3 positions shown · non-contrast
Comparison: None.

CLINICAL DATA: Left ankle pain

EXAM:
LEFT ANKLE COMPLETE - 3+ VIEW

[ankle ap]
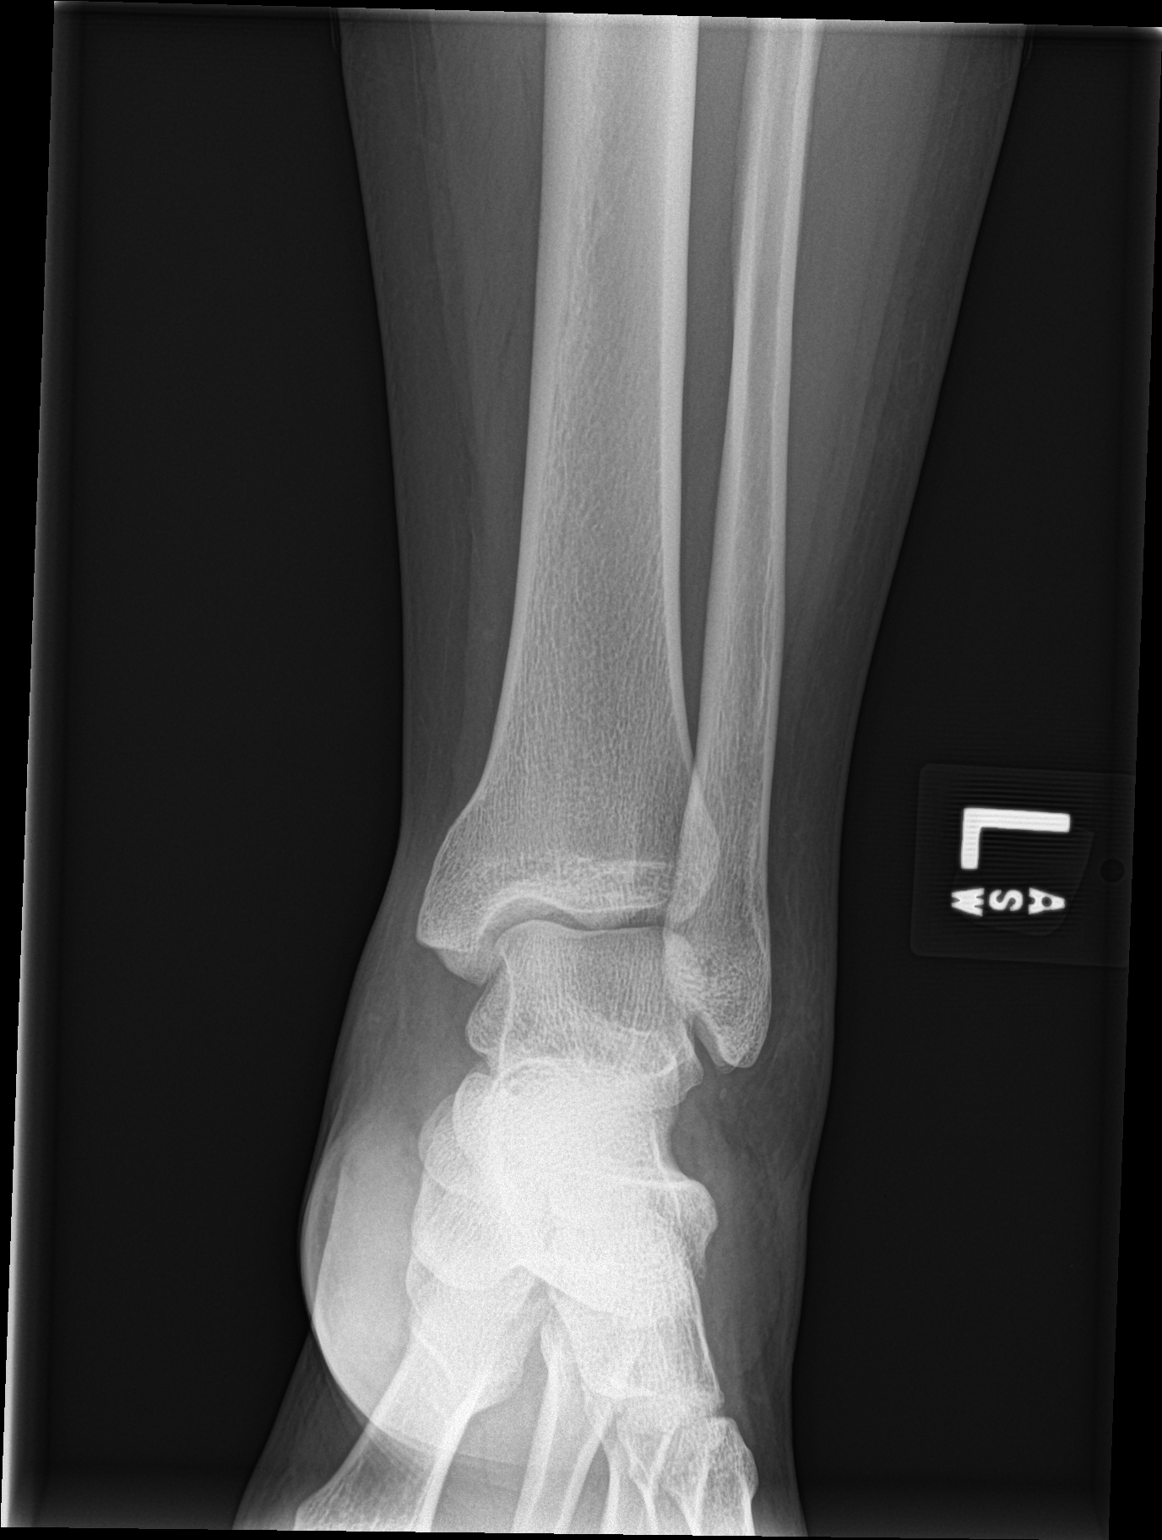

[ankle obl]
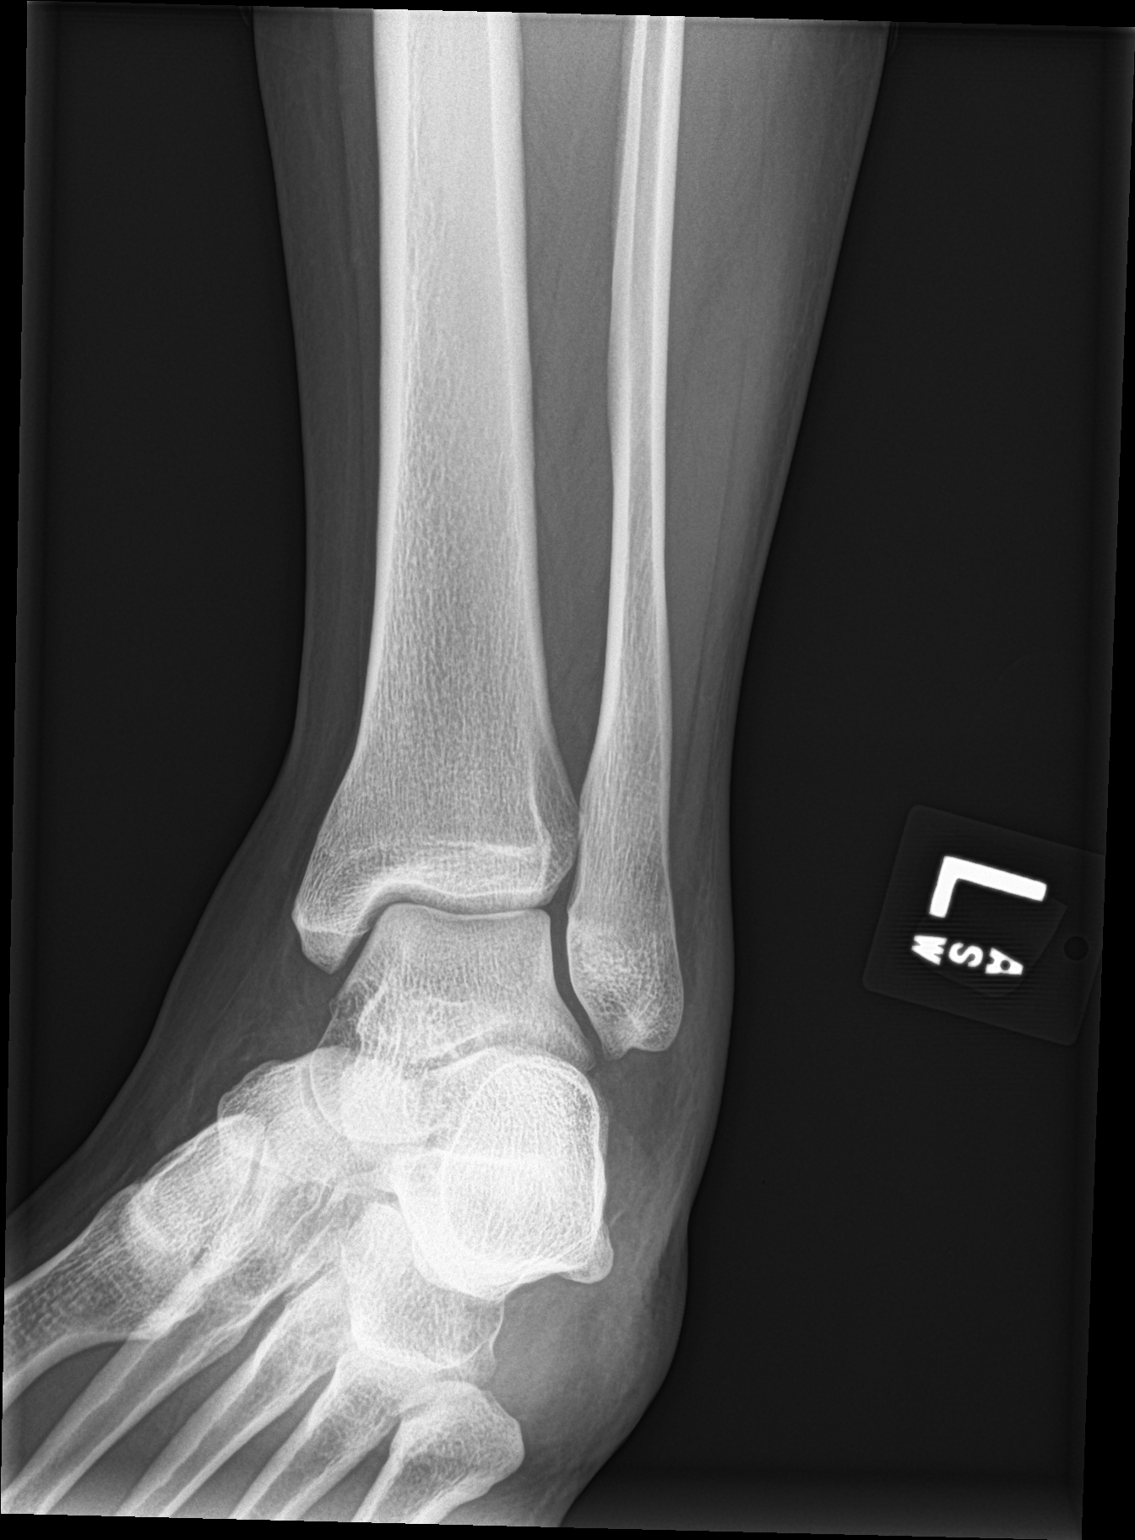

[ankle lat]
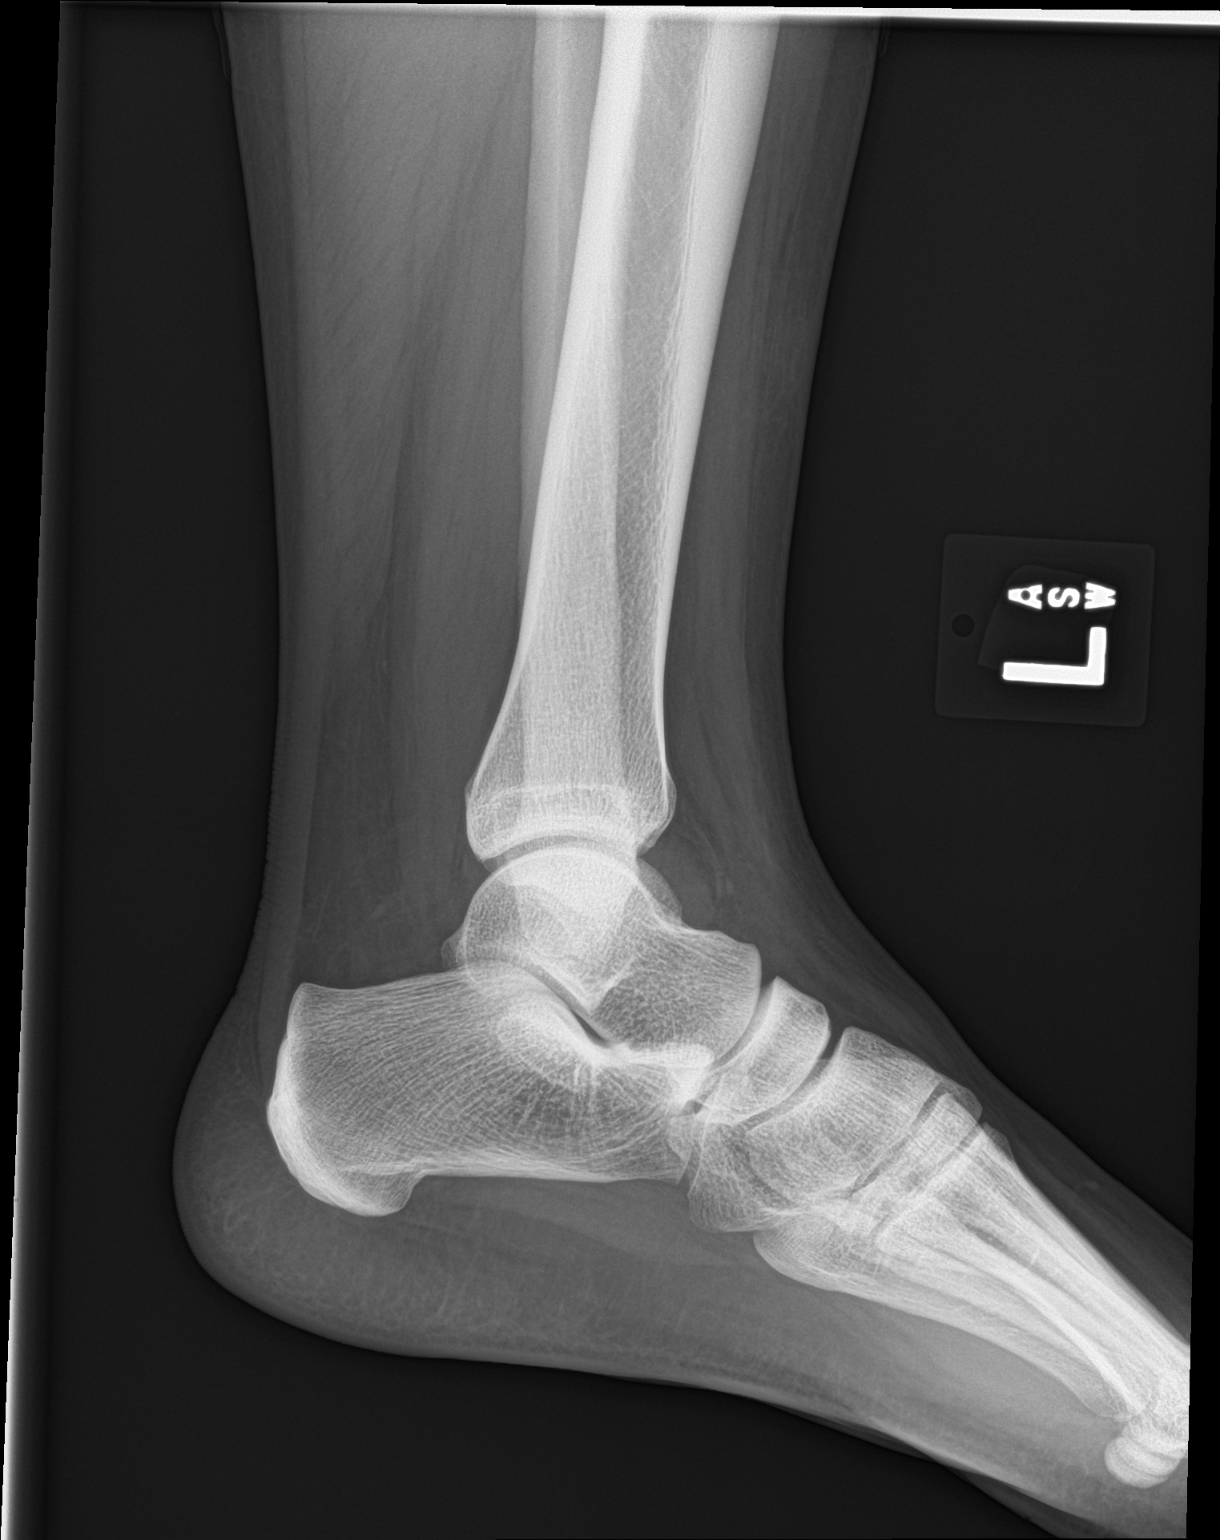

[3 of 3 positions shown; findings below may reference images not displayed]

FINDINGS: There is no evidence of fracture, dislocation, or joint effusion.
There is no evidence of arthropathy or other focal bone abnormality.
Soft tissues are unremarkable.
IMPRESSION: Negative.

## 2021-10-03 ENCOUNTER — Ambulatory Visit
Admission: EM | Admit: 2021-10-03 | Discharge: 2021-10-03 | Disposition: A | Discharge status: Home / Self Care | Payer: 59 | Attending: Urgent Care | Admitting: Urgent Care

## 2021-10-03 DIAGNOSIS — N760 Acute vaginitis: Secondary | ICD-10-CM | POA: Insufficient documentation

## 2021-10-03 DIAGNOSIS — Z8742 Personal history of other diseases of the female genital tract: Secondary | ICD-10-CM | POA: Insufficient documentation

## 2021-10-03 DIAGNOSIS — N898 Other specified noninflammatory disorders of vagina: Secondary | ICD-10-CM | POA: Diagnosis not present

## 2021-10-03 NOTE — Discharge Instructions (Addendum)
Follow-up with your primary care provider or gynecology as needed

## 2021-10-03 NOTE — ED Triage Notes (Signed)
Pt. Was diagnosed with BV over a month ago. Pt. States the first round of antibiotics did not work so she was prescribed a second round. The second round of antibiotics not work as well. Pt. Has hx of chronic BV.

## 2021-10-03 NOTE — ED Provider Notes (Signed)
Roderic Palau    CSN: [id number] Arrival date & time: 10/03/21  1912      History   Chief Complaint Chief Complaint  Patient presents with   Vaginal Discharge    I was seen a month ago my symptoms did not go away. I called to get more meds and was told to come back if symptoms still there - Entered by patient    HPI CAPITOLA LADSON is a 26 y.o. female.    Vaginal Discharge   Presents to urgent care with complaint of BV symptoms.  She reports vaginal discharge.  Was treated Doximity 1 month ago for BV with metronidazole x7 days.  Symptoms recurred and she received a second round of the same medication.  Patient has history of chronic BV and has been treated with boric acid and clindamycin in the past as well.  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 No pertinent surgical history.  OB History   No obstetric history on file.      Home Medications    Prior to Admission medications   Medication Sig Start Date End Date Taking? Authorizing Provider  benzonatate (TESSALON) 100 MG capsule Take 2 capsules (200 mg total) by mouth every 8 (eight) hours. Patient not taking: Reported on 01/12/2021 12/24/20   Margarette Canada, NP  fluconazole (DIFLUCAN) 150 MG tablet Take one tablet as directed. 09/14/21   White, Leitha Schuller, NP  ipratropium (ATROVENT) 0.06 % nasal spray Place 2 sprays into both nostrils 4 (four) times daily. Patient not taking: Reported on 01/12/2021 12/24/20   Margarette Canada, NP  metroNIDAZOLE (FLAGYL) 500 MG tablet Take 1 tablet (500 mg total) by mouth 2 (two) times daily. 08/19/21   Sharion Balloon, NP  metroNIDAZOLE (FLAGYL) 500 MG tablet Take 1 tablet (500 mg total) by mouth 2 (two) times daily. 09/14/21   White, Leitha Schuller, NP   promethazine-dextromethorphan (PROMETHAZINE-DM) 6.25-15 MG/5ML syrup Take 5 mLs by mouth 4 (four) times daily as needed. Patient not taking: Reported on 01/12/2021 12/24/20   Margarette Canada, NP  rizatriptan (MAXALT) 10 MG tablet Take 1 tablet (10 mg total) by mouth as needed for migraine. May repeat in 2 hours if needed Patient not taking: Reported on 01/12/2021 08/25/20   Margarette Canada, NP    Family History Family History  Problem Relation Age of Onset   Cancer Paternal Grandfather     Social History Social History   Tobacco Use   Smoking status: Never   Smokeless tobacco: Never  Vaping Use   Vaping Use: Never used  Substance Use Topics   Alcohol use: Yes   Drug use: Never     Allergies   Amoxicillin and Penicillins   Review of Systems Review of Systems  Genitourinary:  Positive for vaginal discharge.     Physical Exam Triage Vital Signs ED Triage Vitals  Enc Vitals Group     BP 10/03/21 1955 114/81     Pulse Rate 10/03/21 1955 86     Resp 10/03/21 1955 18     Temp 10/03/21 1955 98.8 F (37.1 C)     Temp Source 10/03/21 1955 Oral     SpO2 10/03/21 1955 96 %     Weight --  Height --      Head Circumference --      Peak Flow --      Pain Score 10/03/21 1944 0     Pain Loc --      Pain Edu? --      Excl. in [place]? --    No data found.  Updated Vital Signs BP 114/81 (BP Location: Right Arm)   Pulse 86   Temp 98.8 F (37.1 C) (Oral)   Resp 18   LMP 09/24/2021 (Exact Date)   SpO2 96%   Visual Acuity Right Eye Distance:   Left Eye Distance:   Bilateral Distance:    Right Eye Near:   Left Eye Near:    Bilateral Near:     Physical Exam Vitals reviewed.  Constitutional:      Appearance: Normal appearance.  Skin:    General: Skin is warm and dry.  Neurological:     General: No focal deficit present.     Mental Status: She is alert and oriented to person, place, and time.  Psychiatric:        Mood and Affect: Mood normal.        Behavior:  Behavior normal.      UC Treatments / Results  Labs (all labs ordered are listed, but only abnormal results are displayed) Labs Reviewed - No data to display  EKG   Radiology No results found.  Procedures Procedures (including critical care time)  Medications Ordered in UC Medications - No data to display  Initial Impression / Assessment and Plan / UC Course  I have reviewed the triage vital signs and the nursing notes.  Pertinent labs & imaging results that were available during my care of the patient were reviewed by me and considered in my medical decision making (see chart for details).   Vaginal self swab was obtained.  Discussed possible treatment options with the patient and will await results.   Final Clinical Impressions(s) / UC Diagnoses   Final diagnoses:  None   Discharge Instructions   None    ED Prescriptions   None    PDMP not reviewed this encounter.   Rose Phi, [place] 10/03/21 2010

## 2021-10-06 ENCOUNTER — Telehealth (HOSPITAL_COMMUNITY): Payer: Self-pay | Admitting: Emergency Medicine

## 2021-10-06 LAB — CERVICOVAGINAL ANCILLARY ONLY
Bacterial Vaginitis (gardnerella): POSITIVE — AB
Candida Glabrata: NEGATIVE
Candida Vaginitis: NEGATIVE
Chlamydia: NEGATIVE
Comment: NEGATIVE
Comment: NEGATIVE
Comment: NEGATIVE
Comment: NEGATIVE
Comment: NEGATIVE
Comment: NORMAL
Neisseria Gonorrhea: NEGATIVE
Trichomonas: NEGATIVE

## 2021-10-06 MED ORDER — CLINDAMYCIN HCL 150 MG PO CAPS: 300.0000 mg | ORAL_CAPSULE | Freq: Two times a day (BID) | ORAL | 0 refills | Status: AC

## 2021-10-06 MED ORDER — FLUCONAZOLE 150 MG PO TABS: 150.0000 mg | ORAL_TABLET | Freq: Once | ORAL | 0 refills | Status: AC

## 2021-11-18 ENCOUNTER — Telehealth: Payer: Self-pay

## 2021-11-18 ENCOUNTER — Encounter: Payer: Self-pay | Admitting: Advanced Practice Midwife

## 2021-11-18 ENCOUNTER — Encounter: Payer: Self-pay | Admitting: Family Medicine

## 2021-11-18 MED ORDER — FLUCONAZOLE 150 MG PO TABS: 150.0000 mg | ORAL_TABLET | Freq: Once | ORAL | 0 refills | Status: AC

## 2021-11-18 NOTE — Telephone Encounter (Signed)
See mychart message in reference to order placed today. KW

## 2021-11-21 ENCOUNTER — Ambulatory Visit: Payer: 59 | Admitting: Family Medicine

## 2021-12-13 ENCOUNTER — Encounter: Payer: Self-pay | Admitting: Family Medicine

## 2021-12-13 ENCOUNTER — Ambulatory Visit: Payer: 59 | Admitting: Family Medicine

## 2021-12-13 VITALS — BP 100/60 | HR 116 | Temp 98.9°F | Ht 64.0 in | Wt 217.0 lb

## 2021-12-13 DIAGNOSIS — E860 Dehydration: Secondary | ICD-10-CM

## 2021-12-13 DIAGNOSIS — J020 Streptococcal pharyngitis: Secondary | ICD-10-CM

## 2021-12-13 DIAGNOSIS — J029 Acute pharyngitis, unspecified: Secondary | ICD-10-CM

## 2021-12-13 DIAGNOSIS — Z8619 Personal history of other infectious and parasitic diseases: Secondary | ICD-10-CM | POA: Diagnosis not present

## 2021-12-13 LAB — POCT INFLUENZA A/B
Influenza A, POC: NEGATIVE
Influenza B, POC: NEGATIVE

## 2021-12-13 LAB — POCT RAPID STREP A (OFFICE): Rapid Strep A Screen: POSITIVE — AB

## 2021-12-13 MED ORDER — FLUCONAZOLE 150 MG PO TABS: 150.0000 mg | ORAL_TABLET | Freq: Once | ORAL | 0 refills | Status: AC

## 2021-12-13 MED ORDER — AZITHROMYCIN 250 MG PO TABS: ORAL_TABLET | ORAL | 0 refills | Status: AC

## 2021-12-13 NOTE — Progress Notes (Signed)
Date:  12/13/2021   Name:  Vicki Hill   DOB:  06-24-1995   MRN:  [id number]   Chief Complaint: head pressure (Sore throat, Behind eyes, congestion, body aches, no fever, but chills- started 4 days ago )  Sore Throat  This is a new problem. The current episode started in the past 7 days. The pain is worse on the right side. There has been no fever. The pain is moderate. Pertinent negatives include no abdominal pain, congestion, coughing, diarrhea, drooling, ear discharge, ear pain, headaches, hoarse voice, plugged ear sensation, neck pain, shortness of breath, stridor, swollen glands, trouble swallowing or vomiting. She has had no exposure to strep or mono.    Lab Results  Component Value Date   NA 137 03/20/2017   K 3.5 03/20/2017   CO2 24 03/20/2017   GLUCOSE 89 03/20/2017   BUN 10 03/20/2017   CREATININE 0.64 03/20/2017   CALCIUM 9.4 03/20/2017   GFRNONAA >60 03/20/2017   No results found for: "CHOL", "HDL", "LDLCALC", "LDLDIRECT", "TRIG", "CHOLHDL" No results found for: "TSH" No results found for: "HGBA1C" Lab Results  Component Value Date   WBC 7.9 03/20/2017   HGB 13.3 03/20/2017   HCT 40.3 03/20/2017   MCV 87.3 03/20/2017   PLT 391 03/20/2017   Lab Results  Component Value Date   ALT 16 03/20/2017   AST 24 03/20/2017   ALKPHOS 44 03/20/2017   BILITOT 0.6 03/20/2017   No results found for: "25OHVITD2", "25OHVITD3", "VD25OH"   Review of Systems  Constitutional:  Positive for chills, diaphoresis and fever.  HENT:  Positive for sore throat. Negative for congestion, drooling, ear discharge, ear pain, hoarse voice, rhinorrhea, sinus pressure, sneezing and trouble swallowing.   Respiratory:  Negative for cough, shortness of breath, wheezing and stridor.   Cardiovascular:  Negative for chest pain, palpitations and leg swelling.  Gastrointestinal:  Negative for abdominal pain, diarrhea and vomiting.  Musculoskeletal:  Positive for myalgias. Negative for neck  pain.  Neurological:  Negative for headaches.    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  Social History   Tobacco Use   Smoking status: Never   Smokeless tobacco: Never  Vaping Use   Vaping Use: Never used  Substance Use Topics   Alcohol use: Yes   Drug use: Never     Medication list has been reviewed and updated.  No outpatient medications have been marked as taking for the 12/13/21 encounter (Office Visit) with Juline Patch, MD.       12/13/2021    2:39 PM  GAD 7 : Generalized Anxiety Score  Nervous, Anxious, on Edge 0  Control/stop worrying 0  Worry too much - different things 0  Trouble relaxing 0  Restless 0  Easily annoyed or irritable 0  Afraid - awful might happen 0  Total GAD 7 Score 0  Anxiety Difficulty Not difficult at all       12/13/2021    2:39 PM  Depression screen PHQ 2/9  Decreased Interest 0  Down, Depressed, Hopeless 0  PHQ - 2 Score 0  Altered sleeping 0  Tired, decreased energy 0  Change in appetite 0  Feeling bad or failure about yourself  0  Trouble  concentrating 0  Moving slowly or fidgety/restless 0  Suicidal thoughts 0  PHQ-9 Score 0  Difficult doing work/chores Not difficult at all    BP Readings from Last 3 Encounters:  12/13/21 100/60  10/03/21 114/81  08/19/21 124/82    Physical Exam Vitals and nursing note reviewed. Exam conducted with a chaperone present.  Constitutional:      General: She is not in acute distress.    Appearance: She is not diaphoretic.  HENT:     Head: Normocephalic and atraumatic.     Right Ear: Tympanic membrane and external ear normal.     Left Ear: Tympanic membrane and external ear normal.     Nose: Nose normal. No congestion or rhinorrhea.      Mouth/Throat:     Mouth: Mucous membranes are moist.     Pharynx: Posterior oropharyngeal erythema present. No pharyngeal swelling or oropharyngeal exudate.  Eyes:     General:        Right eye: No discharge.        Left eye: No discharge.     Conjunctiva/sclera: Conjunctivae normal.     Pupils: Pupils are equal, round, and reactive to light.  Neck:     Thyroid: No thyromegaly.     Vascular: No JVD.  Cardiovascular:     Rate and Rhythm: Normal rate and regular rhythm.     Heart sounds: Normal heart sounds. No murmur heard.    No friction rub. No gallop.  Pulmonary:     Effort: Pulmonary effort is normal.     Breath sounds: Normal breath sounds. No decreased breath sounds, wheezing, rhonchi or rales.  Abdominal:     General: Bowel sounds are normal.     Palpations: Abdomen is soft. There is no mass.     Tenderness: There is no abdominal tenderness. There is no guarding or rebound.  Musculoskeletal:        General: Normal range of motion.     Cervical back: Normal range of motion and neck supple.  Lymphadenopathy:     Head:     Right side of head: Tonsillar adenopathy present.     Left side of head: Tonsillar adenopathy present.     Cervical: No cervical adenopathy.     Comments: tender  Skin:    General: Skin is warm and dry.  Neurological:     Mental Status: She is alert.     Deep Tendon Reflexes: Reflexes are normal and symmetric.     Wt Readings from Last 3 Encounters:  12/13/21 217 lb (98.4 kg)  08/19/21 209 lb (94.8 kg)  05/02/21 209 lb (94.8 kg)    BP 100/60   Pulse (!) 116   Temp 98.9 F (37.2 C) (Oral)   Ht '5\' 4"'$  (1.626 m)   Wt 217 lb (98.4 kg)   LMP 11/22/2021 (Approximate)   SpO2 99%   BMI 37.25 kg/m   Assessment and Plan:  1. Sore throat New onset.  Patient has had sore throat for the past 72 hours beginning in the latter part of Saturday.  Sore throat has progressed to the point that is difficulty swallowing liquids but she is able to get  fluids down so as to avoid further dehydration.  Influenza was negative but strep was positive.  Treatment as noted below - POCT Influenza A/B - POCT rapid strep A  2. Pharyngitis due to Streptococcus species New onset.  Persistent.  Patient has a history of allergies to amoxicillin.  We  will treat with azithromycin to 50 mg 2 today followed by 1 a day for 4 days.  Patient has been given a note for work for tomorrow and the next day to hydrate as noted below.  3. Dehydration Patient has mucous membranes with decreased moisture and in the setting of sore throat and decreased oral intake patient has been encouraged over the next 24 to 48 hours to hydrate well.  4. History of candidiasis Patient with history of cellulitis and when she is 5 days out from her last dose of azithromycin she is to take her Diflucan to reduce her risk of candidiasis infection.   Otilio Miu, MD

## 2021-12-15 ENCOUNTER — Ambulatory Visit: Payer: 59 | Admitting: Family Medicine

## 2021-12-30 ENCOUNTER — Ambulatory Visit (INDEPENDENT_AMBULATORY_CARE_PROVIDER_SITE_OTHER): Payer: 59 | Admitting: Family Medicine

## 2021-12-30 ENCOUNTER — Encounter: Payer: Self-pay | Admitting: Family Medicine

## 2021-12-30 VITALS — BP 128/76 | HR 100 | Temp 99.2°F | Ht 64.0 in | Wt 218.0 lb

## 2021-12-30 DIAGNOSIS — Z8619 Personal history of other infectious and parasitic diseases: Secondary | ICD-10-CM

## 2021-12-30 DIAGNOSIS — Z20828 Contact with and (suspected) exposure to other viral communicable diseases: Secondary | ICD-10-CM | POA: Diagnosis not present

## 2021-12-30 DIAGNOSIS — R6889 Other general symptoms and signs: Secondary | ICD-10-CM

## 2021-12-30 DIAGNOSIS — H6993 Unspecified Eustachian tube disorder, bilateral: Secondary | ICD-10-CM

## 2021-12-30 DIAGNOSIS — R509 Fever, unspecified: Secondary | ICD-10-CM | POA: Diagnosis not present

## 2021-12-30 DIAGNOSIS — J0110 Acute frontal sinusitis, unspecified: Secondary | ICD-10-CM

## 2021-12-30 LAB — POCT INFLUENZA A/B
Influenza A, POC: NEGATIVE
Influenza B, POC: NEGATIVE

## 2021-12-30 MED ORDER — TRIAMCINOLONE ACETONIDE 55 MCG/ACT NA AERO: 2.0000 | INHALATION_SPRAY | Freq: Every day | NASAL | 12 refills | Status: DC

## 2021-12-30 MED ORDER — MONTELUKAST SODIUM 10 MG PO TABS: 10.0000 mg | ORAL_TABLET | Freq: Every day | ORAL | 3 refills | Status: DC

## 2021-12-30 MED ORDER — FLUCONAZOLE 150 MG PO TABS: 150.0000 mg | ORAL_TABLET | Freq: Once | ORAL | 0 refills | Status: AC

## 2021-12-30 MED ORDER — DOXYCYCLINE HYCLATE 100 MG PO TABS: 100.0000 mg | ORAL_TABLET | Freq: Two times a day (BID) | ORAL | 0 refills | Status: DC

## 2021-12-30 NOTE — Progress Notes (Signed)
Date:  12/30/2021   Name:  Vicki Hill   DOB:  [birth year]   MRN:  [id number]   Chief Complaint: Nasal Congestion (Headache, nasal congestion especially at night- had full course of ZPack from 12/5-12/15 for strep. COVID negative)  Sinusitis This is a chronic problem. The current episode started in the past 7 days. The problem has been waxing and waning since onset. The pain is mild. Associated symptoms include congestion, ear pain, headaches and sinus pressure. Pertinent negatives include no chills, coughing, shortness of breath, sneezing or sore throat. The treatment provided moderate relief.    Lab Results  Component Value Date   NA 137 03/20/2017   K 3.5 03/20/2017   CO2 24 03/20/2017   GLUCOSE 89 03/20/2017   BUN 10 03/20/2017   CREATININE 0.64 03/20/2017   CALCIUM 9.4 03/20/2017   GFRNONAA >60 03/20/2017   No results found for: "CHOL", "HDL", "LDLCALC", "LDLDIRECT", "TRIG", "CHOLHDL" No results found for: "TSH" No results found for: "HGBA1C" Lab Results  Component Value Date   WBC 7.9 03/20/2017   HGB 13.3 03/20/2017   HCT 40.3 03/20/2017   MCV 87.3 03/20/2017   PLT 391 03/20/2017   Lab Results  Component Value Date   ALT 16 03/20/2017   AST 24 03/20/2017   ALKPHOS 44 03/20/2017   BILITOT 0.6 03/20/2017   No results found for: "25OHVITD2", "25OHVITD3", "VD25OH"   Review of Systems  Constitutional: Negative.  Negative for chills, fatigue, fever and unexpected weight change.  HENT:  Positive for congestion, ear pain and sinus pressure. Negative for ear discharge, nosebleeds, postnasal drip, rhinorrhea, sneezing and sore throat.   Respiratory:  Negative for cough, shortness of breath, wheezing and stridor.   Cardiovascular:  Negative for chest pain, palpitations and leg swelling.  Gastrointestinal:  Negative for abdominal pain, blood in stool, constipation, diarrhea and nausea.  Genitourinary:  Negative for dysuria, flank pain, frequency, hematuria,  urgency and vaginal discharge.  Musculoskeletal:  Negative for arthralgias, back pain and myalgias.  Skin:  Negative for rash.  Neurological:  Positive for headaches. Negative for dizziness and weakness.  Hematological:  Negative for adenopathy. Does not bruise/bleed easily.  Psychiatric/Behavioral:  Negative for dysphoric mood. The patient is not nervous/anxious.     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  Social History   Tobacco Use   Smoking status: Never   Smokeless tobacco: Never  Vaping Use   Vaping Use: Never used  Substance Use Topics   Alcohol use: Yes   Drug use: Never     Medication list has been reviewed and updated.  Current Meds  Medication Sig   rizatriptan (MAXALT) 10 MG tablet Take 1 tablet (10 mg total) by mouth as needed for migraine. May repeat in 2 hours if needed       12/30/2021   11:23 AM 12/13/2021    2:39 PM  GAD 7 : Generalized Anxiety Score  Nervous, Anxious, on Edge 0 0  Control/stop worrying 0 0  Worry too much - different things 0 0  Trouble relaxing 0 0  Restless 0 0  Easily annoyed or irritable 0 0  Afraid - awful might happen 0 0  Total GAD 7 Score 0 0  Anxiety  Difficulty Not difficult at all Not difficult at all       12/30/2021   11:23 AM 12/13/2021    2:39 PM  Depression screen PHQ 2/9  Decreased Interest 0 0  Down, Depressed, Hopeless 0 0  PHQ - 2 Score 0 0  Altered sleeping 0 0  Tired, decreased energy 0 0  Change in appetite 0 0  Feeling bad or failure about yourself  0 0  Trouble concentrating 0 0  Moving slowly or fidgety/restless 0 0  Suicidal thoughts 0 0  PHQ-9 Score 0 0  Difficult doing work/chores Not difficult at all Not difficult at all    BP Readings from  Last 3 Encounters:  12/30/21 128/76  12/13/21 100/60  10/03/21 114/81    Physical Exam HENT:     Right Ear: Tympanic membrane, ear canal and external ear normal.     Left Ear: Tympanic membrane, ear canal and external ear normal.     Nose: Congestion and rhinorrhea present.     Right Turbinates: Swollen.     Left Turbinates: Not swollen.     Right Sinus: Maxillary sinus tenderness and frontal sinus tenderness present.     Left Sinus: Frontal sinus tenderness present. No maxillary sinus tenderness.     Mouth/Throat:     Mouth: Mucous membranes are moist.  Cardiovascular:     Heart sounds: Normal heart sounds. No murmur heard.    No friction rub. No gallop.  Pulmonary:     Breath sounds: Normal breath sounds. No decreased breath sounds, wheezing, rhonchi or rales.  Abdominal:     Tenderness: There is no abdominal tenderness.  Musculoskeletal:     Cervical back: Neck supple. No rigidity.  Lymphadenopathy:     Head:     Right side of head: No submandibular adenopathy.     Left side of head: No submandibular adenopathy.  Neurological:     Mental Status: She is alert.     Wt Readings from Last 3 Encounters:  12/30/21 218 lb (98.9 kg)  12/13/21 217 lb (98.4 kg)  08/19/21 209 lb (94.8 kg)    BP 128/76   Pulse 100   Temp 99.2 F (37.3 C) (Oral)   Ht '5\' 4"'$  (1.626 m)   Wt 218 lb (98.9 kg)   LMP 12/23/2021 (Approximate)   SpO2 99%   BMI 37.42 kg/m   Assessment and Plan:  1. Flu-like symptoms Patient with low-grade fever upper respiratory congestion works in [employer] and exposed to influenza.  Influenza AB read as negative. - POCT Influenza A/B  2. Acute frontal sinusitis, recurrence not specified New onset.  Persistent.  Facial swelling congestion in the frontal and right maxillary.  Exam is consistent with sinusitis and we will treat with doxycycline 100 mg twice a day. - doxycycline (VIBRA-TABS) 100 MG tablet; Take 1 tablet (100 mg total) by mouth 2 (two)  times daily.  Dispense: 20 tablet; Refill: 0  3. Eustachian tube dysfunction, bilateral Patient is having difficulty clearing ears and we will initiate Nasacort nasal spray 2 sprays each nostril once a day and Singulair 10 mg once a day. - triamcinolone (NASACORT) 55 MCG/ACT AERO nasal inhaler; Place 2 sprays into the nose daily.  Dispense: 1 each; Refill: 12 - montelukast (SINGULAIR) 10 MG tablet; Take 1 tablet (10 mg total) by mouth at bedtime.  Dispense: 30 tablet; Refill: 3  4. History of candidiasis Patient with history of candidiasis status post antibiotic and will treat with fluid Diflucan  150 mg once a day. - fluconazole (DIFLUCAN) 150 MG tablet; Take 1 tablet (150 mg total) by mouth once for 1 dose.  Dispense: 1 tablet; Refill: 0    Otilio Miu, MD

## 2022-01-11 ENCOUNTER — Ambulatory Visit
Admission: EM | Admit: 2022-01-11 | Discharge: 2022-01-11 | Disposition: A | Discharge status: Home / Self Care | Payer: 59 | Attending: Emergency Medicine | Admitting: Emergency Medicine

## 2022-01-11 DIAGNOSIS — Z3202 Encounter for pregnancy test, result negative: Secondary | ICD-10-CM | POA: Diagnosis not present

## 2022-01-11 DIAGNOSIS — N644 Mastodynia: Secondary | ICD-10-CM

## 2022-01-11 DIAGNOSIS — L299 Pruritus, unspecified: Secondary | ICD-10-CM

## 2022-01-11 DIAGNOSIS — N6323 Unspecified lump in the left breast, lower outer quadrant: Secondary | ICD-10-CM

## 2022-01-11 LAB — POCT URINE PREGNANCY: Preg Test, Ur: NEGATIVE

## 2022-01-11 MED ORDER — HYDROXYZINE HCL 25 MG PO TABS
25.0000 mg | ORAL_TABLET | Freq: Four times a day (QID) | ORAL | 0 refills | Status: DC | PRN
Start: 2022-01-11 — End: 2022-02-15

## 2022-01-11 NOTE — ED Triage Notes (Addendum)
Patient to Urgent Care with complaints of bilateral breast issues including soreness and itching. Has noticed some skin tags. Multiple scratches from itching.  12/22 started having itching to bilateral breasts, reports they feel sore and hard and her nipples are sensitive.   Recent period.  Denies any new products/ foods/ detergents. Seen by doctor 12/15 diagnosed w/ strep, then seen 12/21 and diagnosed with sinusitis. Completed two rounds of antibiotics.

## 2022-01-11 NOTE — ED Provider Notes (Signed)
Roderic Palau    CSN: [id number] Arrival date & time: 01/11/22  1742      History   Chief Complaint Chief Complaint  Patient presents with   Breast Problem    HPI Vicki Hill is a 27 y.o. female.  Patient presents with 2 week history of bilateral generalized breast itching and soreness.  No treatment at home.  She denies rash, nipple discharge, skin changes, chest pain, shortness of breath, or other symptoms.  She has a mass in her left breast that she has had since 2015 which she reports has been worked up and is benign.   Patient was seen by her PCP on 12/30/2021; diagnosed with flulike symptoms, sinusitis, eustachian tube dysfunction, history of candidiasis; treated with doxycycline, prednisone, montelukast, triamcinolone nasal spray.  She was seen by her PCP on 12/13/2021; diagnosed with strep throat; treated with Zithromax.  LMP current but is lighter than usual.   The history is provided by the patient and medical records.    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 No pertinent surgical history.  OB History   No obstetric history on file.      Home Medications    Prior to Admission medications   Medication Sig Start Date End Date Taking? Authorizing Provider  hydrOXYzine (ATARAX) 25 MG tablet Take 1 tablet (25 mg total) by mouth every 6 (six) hours as needed for itching. 01/11/22  Yes Sharion Balloon, NP  doxycycline (VIBRA-TABS) 100 MG tablet Take 1 tablet (100 mg total) by mouth 2 (two) times daily. 12/30/21   Juline Patch, MD  montelukast (SINGULAIR) 10 MG tablet Take 1 tablet (10 mg total) by mouth at bedtime. 12/30/21   Juline Patch, MD  rizatriptan (MAXALT) 10 MG tablet Take 1 tablet (10 mg total) by mouth as  needed for migraine. May repeat in 2 hours if needed 08/25/20   Margarette Canada, NP  triamcinolone (NASACORT) 55 MCG/ACT AERO nasal inhaler Place 2 sprays into the nose daily. 12/30/21   Juline Patch, MD    Family History Family History  Problem Relation Age of Onset   Cancer Paternal Grandfather     Social History Social History   Tobacco Use   Smoking status: Never   Smokeless tobacco: Never  Vaping Use   Vaping Use: Never used  Substance Use Topics   Alcohol use: Yes   Drug use: Never     Allergies   Amoxicillin and Penicillins   Review of Systems Review of Systems  Constitutional:  Negative for chills and fever.  Respiratory:  Negative for cough and shortness of breath.   Cardiovascular:  Negative for chest pain and palpitations.  Skin:  Negative for color change, rash and wound.  All other systems reviewed and are negative.    Physical Exam Triage Vital Signs ED Triage Vitals  Enc Vitals Group     BP 01/11/22 1845 112/82     Pulse Rate 01/11/22 1833 99     Resp 01/11/22 1833 18     Temp 01/11/22 1833 98.7 F (37.1 C)     Temp src --      SpO2 01/11/22 1833 97 %     Weight 01/11/22 1843  207 lb (93.9 kg)     Height 01/11/22 1843 '5\' 5"'$  (1.651 m)     Head Circumference --      Peak Flow --      Pain Score 01/11/22 1842 7     Pain Loc --      Pain Edu? --      Excl. in [place]? --    No data found.  Updated Vital Signs BP 112/82   Pulse 99   Temp 98.7 F (37.1 C)   Resp 18   Ht '5\' 5"'$  (1.651 m)   Wt 207 lb (93.9 kg)   LMP 12/23/2021 (Approximate)   SpO2 97%   BMI 34.45 kg/m   Visual Acuity Right Eye Distance:   Left Eye Distance:   Bilateral Distance:    Right Eye Near:   Left Eye Near:    Bilateral Near:     Physical Exam Vitals and nursing note reviewed.  Constitutional:      General: She is not in acute distress.    Appearance: Normal appearance. She is well-developed. She is not ill-appearing.  HENT:     Mouth/Throat:     Mouth:  Mucous membranes are moist.  Cardiovascular:     Rate and Rhythm: Normal rate and regular rhythm.     Heart sounds: Normal heart sounds.  Pulmonary:     Effort: Pulmonary effort is normal. No respiratory distress.     Breath sounds: Normal breath sounds.  Chest:  Breasts:    Right: Tenderness present. No inverted nipple, mass, nipple discharge or skin change.     Left: Mass and tenderness present. No inverted nipple, nipple discharge or skin change.     Comments: Approximately 2 cm nontender mass in left breast under areola at 5 o'clock position.   Musculoskeletal:     Cervical back: Neck supple.  Skin:    General: Skin is warm and dry.     Findings: No erythema, lesion or rash.  Neurological:     Mental Status: She is alert.  Psychiatric:        Mood and Affect: Mood normal.        Behavior: Behavior normal.      UC Treatments / Results  Labs (all labs ordered are listed, but only abnormal results are displayed) Labs Reviewed  POCT URINE PREGNANCY    EKG   Radiology No results found.  Procedures Procedures (including critical care time)  Medications Ordered in UC Medications - No data to display  Initial Impression / Assessment and Plan / UC Course  I have reviewed the triage vital signs and the nursing notes.  Pertinent labs & imaging results that were available during my care of the patient were reviewed by me and considered in my medical decision making (see chart for details).    Pruritus, breast tenderness, left breast mass, negative pregnancy test.  The mass in the left breast has been present since 2015; patient reports it has been worked up and is benign.  No rash, erythema, skin changes noted today.  Treating pruritus with hydroxyzine; Precautions for drowsiness with this medication discussed.  Instructed patient to follow-up with her PCP tomorrow.  Education provided on pruritus and breast tenderness.  Patient agrees to plan of care.  Final Clinical  Impressions(s) / UC Diagnoses   Final diagnoses:  Pruritus  Negative pregnancy test  Breast tenderness  Mass of lower outer quadrant of left breast     Discharge Instructions  Take the hydroxyzine as directed for itching.  Do not drive, operate machinery, drink alcohol, or perform dangerous activities while taking this medication as it may cause drowsiness.  Follow up with your primary care provider.        ED Prescriptions     Medication Sig Dispense Auth. Provider   hydrOXYzine (ATARAX) 25 MG tablet Take 1 tablet (25 mg total) by mouth every 6 (six) hours as needed for itching. 12 tablet Sharion Balloon, NP      PDMP not reviewed this encounter.   Sharion Balloon, NP 01/11/22 Einar Crow

## 2022-01-11 NOTE — Discharge Instructions (Addendum)
Take the hydroxyzine as directed for itching.  Do not drive, operate machinery, drink alcohol, or perform dangerous activities while taking this medication as it may cause drowsiness.  Follow up with your primary care provider.

## 2022-01-12 ENCOUNTER — Encounter: Payer: Self-pay | Admitting: Family Medicine

## 2022-01-12 ENCOUNTER — Ambulatory Visit (INDEPENDENT_AMBULATORY_CARE_PROVIDER_SITE_OTHER): Payer: 59 | Admitting: Family Medicine

## 2022-01-12 ENCOUNTER — Ambulatory Visit: Payer: Self-pay

## 2022-01-12 VITALS — BP 110/78 | HR 91 | Ht 65.0 in | Wt 213.0 lb

## 2022-01-12 DIAGNOSIS — N644 Mastodynia: Secondary | ICD-10-CM

## 2022-01-12 DIAGNOSIS — L509 Urticaria, unspecified: Secondary | ICD-10-CM | POA: Diagnosis not present

## 2022-01-12 DIAGNOSIS — N6323 Unspecified lump in the left breast, lower outer quadrant: Secondary | ICD-10-CM

## 2022-01-12 LAB — POCT URINALYSIS DIPSTICK
Bilirubin, UA: NEGATIVE
Blood, UA: NEGATIVE
Glucose, UA: NEGATIVE
Ketones, UA: NEGATIVE
Leukocytes, UA: NEGATIVE
Nitrite, UA: NEGATIVE
Protein, UA: NEGATIVE
Spec Grav, UA: 1.01 (ref 1.010–1.025)
Urobilinogen, UA: 0.2 E.U./dL
pH, UA: 6 (ref 5.0–8.0)

## 2022-01-12 NOTE — Patient Instructions (Signed)
Hives Hives (urticaria) are itchy, red, swollen areas on the skin. Hives can appear on any part of the body. Hives often fade within 24 hours (acute hives). Sometimes, new hives appear after old ones fade and the cycle can continue for several days or weeks (chronic hives). Hives do not spread from person to person (are not contagious). Hives come from the body's reaction to something a person is allergic to (allergen), something that causes irritation, or various other triggers. When a person is exposed to a trigger, his or her body releases a chemical (histamine) that causes redness, itching, and swelling. Hives can appear right after exposure to a trigger or hours later. What are the causes? This condition may be caused by: Allergies to foods or ingredients. Insect bites or stings. Exposure to pollen or pets. Spending time in sunlight, heat, or cold (exposure). Exercise. Stress. You can also get hives from other medical conditions and treatments, such as: Viruses, including the common cold. Bacterial infections, such as urinary tract infections and strep throat. Certain medicines. Contact with latex or chemicals. Allergy shots. Blood transfusions. Sometimes, the cause of this condition is not known (idiopathic hives). What increases the risk? You are more likely to develop this condition if you: Are a woman. Have food allergies, especially to citrus fruits, milk, eggs, peanuts, tree nuts, or shellfish. Are allergic to: Medicines. Latex. Insects. Animals. Pollen. What are the signs or symptoms? Common symptoms of this condition include raised, itchy, red or white bumps or patches on your skin. These areas may: Become large and swollen (welts). Change in shape and location, quickly and repeatedly. Be separate hives or connect over a large area of skin. Sting or become painful. Turn white when pressed in the center (blanch). In severe cases, your hands, feet, and face may also  become swollen. This may occur if hives develop deeper in your skin. How is this diagnosed? This condition may be diagnosed by your symptoms, medical history, and physical exam. Your skin, urine, or blood may be tested to find out what is causing your hives and to rule out other health issues. Your health care provider may also remove a small sample of skin from the affected area and examine it under a microscope (biopsy). How is this treated? Treatment for this condition depends on the cause and severity of your symptoms. Your health care provider may recommend using cool, wet cloths (cool compresses) or taking cool showers to relieve itching. Treatment may include: Medicines that help: Relieve itching (antihistamines). Reduce swelling (corticosteroids). Treat infection (antibiotics). An injectable medicine (omalizumab). Your health care provider may prescribe this if you have chronic idiopathic hives and you continue to have symptoms even after treatment with antihistamines. Severe cases may require an emergency injection of adrenaline (epinephrine) to prevent a life-threatening allergic reaction (anaphylaxis). Follow these instructions at home: Medicines Take and apply over-the-counter and prescription medicines only as told by your health care provider. If you were prescribed an antibiotic medicine, take it as told by your health care provider. Do not stop using the antibiotic even if you start to feel better. Skin care Apply cool compresses to the affected areas. Do not scratch or rub your skin. General instructions Do not take hot showers or baths. This can make itching worse. Do not wear tight-fitting clothing. Use sunscreen and wear protective clothing when you are outside. Avoid any substances that cause your hives. Keep a journal to help track what causes your hives. Write down: What medicines you take.   What you eat and drink. What products you use on your skin. Keep all  follow-up visits as told by your health care provider. This is important. Contact a health care provider if: Your symptoms are not controlled with medicine. Your joints are painful or swollen. Get help right away if: You have a fever. You have pain in your abdomen. Your tongue or lips are swollen. Your eyelids are swollen. Your chest or throat feels tight. You have trouble breathing or swallowing. These symptoms may represent a serious problem that is an emergency. Do not wait to see if the symptoms will go away. Get medical help right away. Call your local emergency services (911 in the [place] Do not drive yourself to the hospital. Summary Hives (urticaria) are itchy, red, swollen areas on your skin. Hives come from the body's reaction to something a person is allergic to (allergen), something that causes irritation, or various other triggers. Treatment for this condition depends on the cause and severity of your symptoms. Avoid any substances that cause your hives. Keep a journal to help track what causes your hives. Take and apply over-the-counter and prescription medicines only as told by your health care provider. Get help right away if your chest or throat feels tight or if you have trouble breathing or swallowing. This information is not intended to replace advice given to you by your health care provider. Make sure you discuss any questions you have with your health care provider. Document Revised: 05/11/2021 Document Reviewed: 02/15/2020 Elsevier Patient Education  2023 [employer]vier Inc.  

## 2022-01-12 NOTE — Progress Notes (Signed)
Date:  01/12/2022   Name:  Vicki Hill   DOB:  1995-10-04   MRN:  [id number]   Chief Complaint: breast tenderness  Patient is a 27 year old female who presents for a  breast exam. The patient reports the following problems: overall pruritis. Health maintenance has been reviewed upto date.      Lab Results  Component Value Date   NA 137 03/20/2017   K 3.5 03/20/2017   CO2 24 03/20/2017   GLUCOSE 89 03/20/2017   BUN 10 03/20/2017   CREATININE 0.64 03/20/2017   CALCIUM 9.4 03/20/2017   GFRNONAA >60 03/20/2017   No results found for: "CHOL", "HDL", "LDLCALC", "LDLDIRECT", "TRIG", "CHOLHDL" No results found for: "TSH" No results found for: "HGBA1C" Lab Results  Component Value Date   WBC 7.9 03/20/2017   HGB 13.3 03/20/2017   HCT 40.3 03/20/2017   MCV 87.3 03/20/2017   PLT 391 03/20/2017   Lab Results  Component Value Date   ALT 16 03/20/2017   AST 24 03/20/2017   ALKPHOS 44 03/20/2017   BILITOT 0.6 03/20/2017   No results found for: "25OHVITD2", "25OHVITD3", "VD25OH"   Review of Systems  Constitutional:  Negative for chills and fever.  HENT:  Negative for drooling, ear discharge, ear pain and sore throat.   Respiratory:  Negative for cough, shortness of breath and wheezing.   Cardiovascular:  Negative for chest pain, palpitations and leg swelling.  Gastrointestinal:  Negative for abdominal pain, blood in stool, constipation, diarrhea and nausea.  Endocrine: Negative for polydipsia.  Genitourinary:  Negative for dysuria, frequency, hematuria and urgency.  Musculoskeletal:  Negative for back pain, myalgias and neck pain.  Skin:  Negative for rash.  Allergic/Immunologic: Negative for environmental allergies.  Neurological:  Negative for dizziness and headaches.  Hematological:  Does not bruise/bleed easily.  Psychiatric/Behavioral:  Negative for suicidal ideas. The patient is not nervous/anxious.     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  Social History   Tobacco Use   Smoking status: Never   Smokeless tobacco: Never  Vaping Use   Vaping Use: Never used  Substance Use Topics   Alcohol use: Yes   Drug use: Never     Medication list has been reviewed and updated.  Current Meds  Medication Sig   hydrOXYzine (ATARAX) 25 MG tablet Take 1 tablet (25 mg total) by mouth every 6 (six) hours as needed for itching.   montelukast (SINGULAIR) 10 MG tablet Take 1 tablet (10 mg total) by mouth at bedtime.   rizatriptan (MAXALT) 10 MG tablet Take 1 tablet (10 mg total) by mouth as needed for migraine. May repeat in 2 hours if needed   triamcinolone (NASACORT) 55 MCG/ACT AERO nasal inhaler Place 2 sprays into the nose daily.       12/30/2021   11:23 AM 12/13/2021    2:39 PM  GAD 7 : Generalized Anxiety Score  Nervous, Anxious, on Edge 0 0  Control/stop worrying 0 0  Worry too much - different things 0 0  Trouble relaxing 0 0  Restless 0 0  Easily annoyed or irritable 0 0  Afraid - awful might happen 0 0  Total GAD 7 Score 0 0  Anxiety  Difficulty Not difficult at all Not difficult at all       12/30/2021   11:23 AM 12/13/2021    2:39 PM  Depression screen PHQ 2/9  Decreased Interest 0 0  Down, Depressed, Hopeless 0 0  PHQ - 2 Score 0 0  Altered sleeping 0 0  Tired, decreased energy 0 0  Change in appetite 0 0  Feeling bad or failure about yourself  0 0  Trouble concentrating 0 0  Moving slowly or fidgety/restless 0 0  Suicidal thoughts 0 0  PHQ-9 Score 0 0  Difficult doing work/chores Not difficult at all Not difficult at all    BP Readings from Last 3 Encounters:  01/12/22 110/78  01/11/22 112/82  12/30/21 128/76    Physical Exam Vitals and nursing note reviewed. Exam  conducted with a chaperone present.  HENT:     Right Ear: Tympanic membrane normal.     Left Ear: Tympanic membrane normal.     Mouth/Throat:     Mouth: Mucous membranes are moist.  Cardiovascular:     Rate and Rhythm: Normal rate and regular rhythm.     Heart sounds: No murmur heard.    No friction rub. No gallop.  Pulmonary:     Effort: No respiratory distress.     Breath sounds: No wheezing, rhonchi or rales.  Chest:  Breasts:    Right: No swelling, bleeding, inverted nipple, mass, nipple discharge, skin change or tenderness.     Left: Mass present. No swelling, bleeding, inverted nipple, nipple discharge, skin change or tenderness.     Comments: Golf ball size left auricular/generalized tenderness breasts Abdominal:     Palpations: There is no hepatomegaly or splenomegaly.     Tenderness: There is no abdominal tenderness.  Musculoskeletal:     Cervical back: No tenderness.  Lymphadenopathy:     Cervical: No cervical adenopathy.     Upper Body:     Right upper body: No supraclavicular or axillary adenopathy.     Left upper body: No supraclavicular or axillary adenopathy.  Neurological:     Mental Status: She is alert.     Wt Readings from Last 3 Encounters:  01/12/22 213 lb (96.6 kg)  01/11/22 207 lb (93.9 kg)  12/30/21 218 lb (98.9 kg)    BP 110/78   Pulse 91   Ht '5\' 5"'$  (1.651 m)   Wt 213 lb (96.6 kg)   LMP 12/23/2021 (Approximate)   SpO2 99%   BMI 35.45 kg/m   Assessment and Plan: 1. Urticaria of entire body Patient has had recent development of urticaria that is stimulated by warm showers and activity.  We have reviewed multiple areas including medications, foods, as an applied substances.  I have suggested that she stop her Singulair just for completeness sake and to change her detergent to a hypoallergenic brand.  She has been suggested to pick up Zyrtec for daytime taking and to take her hydroxyzine at night that was previously prescribed by urgent care.   Dipstick was done to make sure that there was no elevation of bilirubin in the urine to suggest that there may be a reaction to bilirubin. - POCT Urinalysis Dipstick  2. 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  Patient is returning today for the this discussion and we will schedule for an ultrasound as we try  to get previous ultrasound for comparison.  Breast exam today does note a rather enlarged 2 x 3 cm movable mass areolar area just to the left of the nipple.  There is no palpable adenopathy. - US BREAST LTD UNI LEFT INC AXILLA  3. Breast tenderness Patient has had generalized breast tenderness which I have encouraged her not to palpate the area which may stimulate nipple which may cause some breast tenderness in the meantime patient has had negative urine pregnancy test but we will check a serum pregnancy test for absolute sake. - hCG, serum, qualitative - US BREAST LTD UNI LEFT INC AXILLA     Otilio Miu, MD

## 2022-01-13 ENCOUNTER — Ambulatory Visit: Payer: 59 | Admitting: Family Medicine

## 2022-01-13 LAB — HCG, SERUM, QUALITATIVE: hCG,Beta Subunit,Qual,Serum: NEGATIVE m[IU]/mL (ref <6)

## 2022-01-17 ENCOUNTER — Other Ambulatory Visit: Payer: Self-pay | Admitting: *Deleted

## 2022-01-17 ENCOUNTER — Inpatient Hospital Stay
Admission: RE | Admit: 2022-01-17 | Discharge: 2022-01-17 | Disposition: A | Discharge status: Home / Self Care | Payer: Self-pay | Source: Ambulatory Visit | Attending: *Deleted | Admitting: *Deleted

## 2022-01-17 DIAGNOSIS — Z1231 Encounter for screening mammogram for malignant neoplasm of breast: Secondary | ICD-10-CM

## 2022-01-18 ENCOUNTER — Ambulatory Visit
Admission: RE | Admit: 2022-01-18 | Discharge: 2022-01-18 | Disposition: A | Discharge status: Home / Self Care | Payer: 59 | Source: Ambulatory Visit | Attending: Family Medicine | Admitting: Family Medicine

## 2022-01-18 DIAGNOSIS — N6323 Unspecified lump in the left breast, lower outer quadrant: Secondary | ICD-10-CM | POA: Insufficient documentation

## 2022-01-18 DIAGNOSIS — N644 Mastodynia: Secondary | ICD-10-CM | POA: Insufficient documentation

## 2022-01-19 ENCOUNTER — Other Ambulatory Visit: Payer: Self-pay | Admitting: Family Medicine

## 2022-01-19 DIAGNOSIS — N63 Unspecified lump in unspecified breast: Secondary | ICD-10-CM

## 2022-01-19 DIAGNOSIS — R928 Other abnormal and inconclusive findings on diagnostic imaging of breast: Secondary | ICD-10-CM

## 2022-01-25 ENCOUNTER — Ambulatory Visit
Admission: RE | Admit: 2022-01-25 | Discharge: 2022-01-25 | Disposition: A | Discharge status: Home / Self Care | Payer: 59 | Source: Ambulatory Visit | Attending: Family Medicine | Admitting: Family Medicine

## 2022-01-25 DIAGNOSIS — N63 Unspecified lump in unspecified breast: Secondary | ICD-10-CM | POA: Insufficient documentation

## 2022-01-25 DIAGNOSIS — R928 Other abnormal and inconclusive findings on diagnostic imaging of breast: Secondary | ICD-10-CM | POA: Insufficient documentation

## 2022-01-25 HISTORY — PX: BREAST BIOPSY: SHX20

## 2022-01-25 MED ORDER — LIDOCAINE-EPINEPHRINE 1 %-1:100000 IJ SOLN
8.0000 mL | Freq: Once | INTRAMUSCULAR | Status: AC
  Administered 2022-01-25: 8 mL
  Filled 2022-01-25: qty 8

## 2022-01-25 MED ORDER — LIDOCAINE HCL (PF) 1 % IJ SOLN
3.0000 mL | Freq: Once | INTRAMUSCULAR | Status: AC
  Administered 2022-01-25: 3 mL
  Filled 2022-01-25: qty 4

## 2022-01-26 LAB — SURGICAL PATHOLOGY

## 2022-01-30 ENCOUNTER — Encounter: Payer: Self-pay | Admitting: *Deleted

## 2022-01-30 DIAGNOSIS — D242 Benign neoplasm of left breast: Secondary | ICD-10-CM

## 2022-01-30 NOTE — Progress Notes (Signed)
Referral recieved from [facility] for benign breast mass.  Referral sent to [place] [facility] per patient preference.  No further needs at this time.

## 2022-02-01 NOTE — Progress Notes (Unsigned)
Patient ID: Vicki Hill, female   DOB: 05/07/1995, 27 y.o.   MRN: [id number]  Chief Complaint: Left breast mass  History of Present Illness Vicki Hill is a 27 y.o. female with a known left breast mass since 2016.  Evaluated previously by ultrasound.  Some degree of tenderness noted recently which raised concern, and prompted reevaluation with ultrasound.  Slightly larger over the interval, a biopsy was obtained consistent with benign fibroadenoma.  Patient desires to have the mass removed.  She began menstruating at the age of 7, she has never been pregnant.  She has no family history of breast cancer.  She denies any prior hormonal medication.  She has the known lump since 2016, but denies any skin changes, breast pain, nipple discharge.  Past Medical History Past Medical History:  Diagnosis Date   BV (bacterial vaginosis)    Recurrent   Migraine       Past Surgical History:  Procedure Laterality Date   BREAST BIOPSY Left 01/25/2022   US LT BREAST BX W LOC DEV 1ST LESION IMG BX SPEC US GUIDE 01/25/2022 [facility]    Allergies  Allergen Reactions   Amoxicillin Anaphylaxis   Penicillins Anaphylaxis    Current Outpatient Medications  Medication Sig Dispense Refill   hydrOXYzine (ATARAX) 25 MG tablet Take 1 tablet (25 mg total) by mouth every 6 (six) hours as needed for itching. 12 tablet 0   montelukast (SINGULAIR) 10 MG tablet Take 1 tablet (10 mg total) by mouth at bedtime. 30 tablet 3   rizatriptan (MAXALT) 10 MG tablet Take 1 tablet (10 mg total) by mouth as needed for migraine. May repeat in 2 hours if needed 10 tablet 0   triamcinolone (NASACORT) 55 MCG/ACT AERO nasal inhaler Place 2 sprays into the nose daily. 1 each 12   No current facility-administered medications for this visit.    Family History Family History  Problem Relation Age of Onset   Cancer Paternal Grandfather       Social History Social History   Tobacco Use   Smoking status:  Never   Smokeless tobacco: Never  Vaping Use   Vaping Use: Never used  Substance Use Topics   Alcohol use: Yes   Drug use: Never        Review of Systems  All other systems reviewed and are negative.    Physical Exam Blood pressure 116/82, pulse 94, temperature 98.9 F (37.2 C), temperature source Oral, height 5' 4" (1.626 m), weight 210 lb (95.3 kg), SpO2 98 %. Last Weight  Most recent update: 02/02/2022 10:58 AM    Weight  95.3 kg (210 lb)             CONSTITUTIONAL: Well developed, and nourished, appropriately responsive and aware without distress.   EYES: Sclera non-icteric.   EARS, NOSE, MOUTH AND THROAT:  The oropharynx is clear. Oral mucosa is pink and moist.    Hearing is intact to voice.  NECK: Trachea is midline, and there is no jugular venous distension.  LYMPH NODES:  Lymph nodes in the neck are not appreciated. RESPIRATORY:   Normal respiratory effort without pathologic use of accessory muscles. CARDIOVASCULAR:  Well perfused.  GI: The abdomen is  soft, nontender, and nondistended. There were no palpable masses. I did not appreciate hepatosplenomegaly. There were normal bowel sounds. GU: Vicki Hill: Left breast: readily palpable mobile mass immediately underneath the inferior aspect of the areolar margin, discrete and well-defined.  No evidence of overlying   skin changes, nipple discharge, dimpling or puckering.  No other appreciable masses present. MUSCULOSKELETAL:  Symmetrical muscle tone appreciated in all four extremities.    SKIN: Skin turgor is normal. No pathologic skin lesions appreciated.  NEUROLOGIC:  Motor and sensation appear grossly normal.  Cranial nerves are grossly without defect. PSYCH:  Alert and oriented to person, place and time. Affect is appropriate for situation.  Data Reviewed I have personally reviewed what is currently available of the patient's imaging, recent labs and medical records.   Labs:     Latest Ref Rng &  Units 03/20/2017   12:45 AM  CBC  WBC 3.6 - 11.0 K/uL 7.9   Hemoglobin 12.0 - 16.0 g/dL 13.3   Hematocrit 35.0 - 47.0 % 40.3   Platelets 150 - 440 K/uL 391       Latest Ref Rng & Units 03/20/2017   12:45 AM  CMP  Glucose 65 - 99 mg/dL 89   BUN 6 - 20 mg/dL 10   Creatinine 0.44 - 1.00 mg/dL 0.64   Sodium 135 - 145 mmol/L 137   Potassium 3.5 - 5.1 mmol/L 3.5   Chloride 101 - 111 mmol/L 103   CO2 22 - 32 mmol/L 24   Calcium 8.9 - 10.3 mg/dL 9.4   Total Protein 6.5 - 8.1 g/dL 7.8   Total Bilirubin 0.3 - 1.2 mg/dL 0.6   Alkaline Phos 38 - 126 U/L 44   AST 15 - 41 U/L 24   ALT 14 - 54 U/L 16    SURGICAL PATHOLOGY  CASE: [id number]  PATIENT: Vicki Hill  Surgical Pathology Report   Specimen Submitted:  A. Breast, left   Clinical History: Oval solid mass favor benign FA. Ribbon-shaped clip  placed following ultrasound guided biopsy of LEFT breast at 1 o'clock.   DIAGNOSIS:  A. BREAST, LEFT AT 3:00, RETROAREOLAR AND 1 CM FROM THE NIPPLE;  ULTRASOUND-GUIDED CORE NEEDLE BIOPSY:  - MINUTE DISAGGREGATED FRAGMENTS OF BENIGN MAMMARY PARENCHYMA WITH  FIBROEPITHELIAL PROLIFERATION, FAVOR BENIGN FIBROADENOMA  - NEGATIVE FOR ATYPICAL PROLIFERATIVE BREAST DISEASE.   Imaging: Radiological images reviewed:  CLINICAL DATA:  27-year-old female presenting for evaluation of a left breast mass that has been tender more recently. Patient was evaluated for this mass in 2016.   EXAM: ULTRASOUND OF THE LEFT BREAST   COMPARISON:  Previous exam(s).   FINDINGS: On physical exam, in the retroareolar left breast I feel a discrete mobile mass.   Targeted ultrasound is performed at the palpable site of concern in the left breast at 3 o'clock 1 cm from the nipple demonstrating an oval circumscribed hypoechoic mass measuring 4.0 x 2.2 x 3.8 cm, previously measuring 2.9 x 2.0 cm.   Targeted ultrasound of the left axilla demonstrates normal lymph nodes.   IMPRESSION: Indeterminate mass  in the left breast at 3 o'clock which has increased in size compared to 2016. While this most likely represents a fibroadenoma tissue sampling is recommended given interval growth.   RECOMMENDATION: Ultrasound-guided core needle biopsy x1 of the left breast.   I have discussed the findings and recommendations with the patient. If applicable, a reminder letter will be sent to the patient regarding the next appointment.   BI-RADS CATEGORY  4: Suspicious.     Electronically Signed   By: Nancy  Ballantyne M.D.   On: 01/18/2022 14:12 Within last 24 hrs: No results found.  ADDENDUM REPORT: 01/27/2022 15:49   ADDENDUM: PATHOLOGY revealed: A. BREAST, LEFT AT 3:00, RETROAREOLAR AND 1 CM   FROM THE NIPPLE; ULTRASOUND-GUIDED CORE NEEDLE BIOPSY: - MINUTE DISAGGREGATED FRAGMENTS OF BENIGN MAMMARY PARENCHYMA WITH FIBROEPITHELIAL PROLIFERATION, FAVOR BENIGN FIBROADENOMA - NEGATIVE FOR ATYPICAL PROLIFERATIVE BREAST DISEASE.   Pathology results are CONCORDANT with imaging findings, per Dr. Nancy Ballantyne with possible excision recommended given growth and size (4.0).   Pathology results and recommendations were discussed with patient via telephone on 01/26/2022. Patient reported biopsy site doing well with no adverse symptoms, and only slight tenderness at the site. Post biopsy care instructions were reviewed, questions were answered and my direct phone number was provided. Patient was instructed to call [facility] for any additional questions or concerns related to biopsy site.   RECOMMENDATION: Surgical consultation for consideration of excision due to growth and size (4.0 cm). Request for surgical consultation relayed to Ana Moore RN at [facility] by Linda Nash RN on 01/27/2022.   Pathology results reported by Linda Nash RN on 01/27/2022.     Electronically Signed   By: Nancy  Ballantyne M.D.   On: 01/27/2022 15:49    Addended by Ballantyne, Nancy  C, MD on 01/27/2022  5:49 PM  ADDENDUM REPORT: 01/25/2022 11:29   ADDENDUM: The biopsied mass in the left breast at 3 o'clock retroareolar.     Electronically Signed   By: Nancy  Ballantyne M.D.   On: 01/25/2022 11:29    Addended by Ballantyne, Nancy C, MD on 01/25/2022  1:29 PM    Study Result  Narrative & Impression  CLINICAL DATA:  27-year-old female presenting for biopsy of a mass in the left breast.   EXAM: ULTRASOUND GUIDED LEFT BREAST CORE NEEDLE BIOPSY   COMPARISON:  Previous exam(s).   PROCEDURE: I met with the patient and we discussed the procedure of ultrasound-guided biopsy, including benefits and alternatives. We discussed the high likelihood of a successful procedure. We discussed the risks of the procedure, including infection, bleeding, tissue injury, clip migration, and inadequate sampling. Informed written consent was given. The usual time-out protocol was performed immediately prior to the procedure.   Lesion quadrant: Upper outer quadrant   Using sterile technique and 1% Lidocaine as local anesthetic, under direct ultrasound visualization, a 14 gauge spring-loaded device was used to perform biopsy of a mass in the left breast at retroareolar 1 o'clock using a lateral approach. At the conclusion of the procedure a ribbon shaped tissue marker clip was deployed into the biopsy cavity. Follow up 2 view mammogram was performed and dictated separately.   IMPRESSION: Ultrasound guided biopsy of a mass in the left breast at retroareolar 1 o'clock. No apparent complications.   Electronically Signed: By: Nancy  Ballantyne M.D. On: 01/25/2022 11:08       Study Result  Narrative & Impression  CLINICAL DATA:  27-year-old female presenting for evaluation of a left breast mass that has been tender more recently. Patient was evaluated for this mass in 2016.   EXAM: ULTRASOUND OF THE LEFT BREAST   COMPARISON:  Previous exam(s).   FINDINGS: On  physical exam, in the retroareolar left breast I feel a discrete mobile mass.   Targeted ultrasound is performed at the palpable site of concern in the left breast at 3 o'clock 1 cm from the nipple demonstrating an oval circumscribed hypoechoic mass measuring 4.0 x 2.2 x 3.8 cm, previously measuring 2.9 x 2.0 cm.   Targeted ultrasound of the left axilla demonstrates normal lymph nodes.   IMPRESSION: Indeterminate mass in the left breast at 3 o'clock which has increased in size   compared to 2016. While this most likely represents a fibroadenoma tissue sampling is recommended given interval growth.   RECOMMENDATION: Ultrasound-guided core needle biopsy x1 of the left breast.   I have discussed the findings and recommendations with the patient. If applicable, a reminder letter will be sent to the patient regarding the next appointment.   BI-RADS CATEGORY  4: Suspicious.     Electronically Signed   By: Nancy  Ballantyne M.D.   On: 01/18/2022 14:12    Assessment    Fibroadenoma left breast. 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  Risks and benefits above procedure discussed with patient in detail these include anesthesia, bleeding, infection, cosmetic change.  I believe she understands these are not all inclusive.  She understands she has the option of continuing observation of this lesion which is likely benign and of little threat.  She would like to proceed with removal. Risks excepted with questions answered.  No guarantees were expressed or implied. Face-to-face time spent with the patient and accompanying care providers(if present) was 30 minutes, with more than 50% of the time spent counseling, educating, and coordinating care of the patient.    These notes generated with voice recognition software. I  apologize for typographical errors.  Aziz Slape M.D., FACS 02/02/2022, 1:15 PM     

## 2022-02-01 NOTE — H&P (View-Only) (Signed)
Patient ID: Vicki Hill, female   DOB: October 14, 1995, 27 y.o.   MRN: [id number]  Chief Complaint: Left breast mass  History of Present Illness Vicki Hill is a 28 y.o. female with a known left breast mass since 2016.  Evaluated previously by ultrasound.  Some degree of tenderness noted recently which raised concern, and prompted reevaluation with ultrasound.  Slightly larger over the interval, a biopsy was obtained consistent with benign fibroadenoma.  Patient desires to have the mass removed.  She began menstruating at the age of 56, she has never been pregnant.  She has no family history of breast cancer.  She denies any prior hormonal medication.  She has the known lump since 2016, but denies any skin changes, breast pain, nipple discharge.  Past Medical History Past Medical History:  Diagnosis Date   BV (bacterial vaginosis)    Recurrent   Migraine       Past Surgical History:  Procedure Laterality Date   BREAST BIOPSY Left 01/25/2022   [place] LT BREAST BX W LOC DEV 1ST LESION IMG BX SPEC US GUIDE 01/25/2022 [facility]    Allergies  Allergen Reactions   Amoxicillin Anaphylaxis   Penicillins Anaphylaxis    Current Outpatient Medications  Medication Sig Dispense Refill   hydrOXYzine (ATARAX) 25 MG tablet Take 1 tablet (25 mg total) by mouth every 6 (six) hours as needed for itching. 12 tablet 0   montelukast (SINGULAIR) 10 MG tablet Take 1 tablet (10 mg total) by mouth at bedtime. 30 tablet 3   rizatriptan (MAXALT) 10 MG tablet Take 1 tablet (10 mg total) by mouth as needed for migraine. May repeat in 2 hours if needed 10 tablet 0   triamcinolone (NASACORT) 55 MCG/ACT AERO nasal inhaler Place 2 sprays into the nose daily. 1 each 12   No current facility-administered medications for this visit.    Family History Family History  Problem Relation Age of Onset   Cancer Paternal Grandfather       Social History Social History   Tobacco Use   Smoking status:  Never   Smokeless tobacco: Never  Vaping Use   Vaping Use: Never used  Substance Use Topics   Alcohol use: Yes   Drug use: Never        Review of Systems  All other systems reviewed and are negative.    Physical Exam Blood pressure 116/82, pulse 94, temperature 98.9 F (37.2 C), temperature source Oral, height 5' 4"$  (1.626 m), weight 210 lb (95.3 kg), SpO2 98 %. Last Weight  Most recent update: 02/02/2022 10:58 AM    Weight  95.3 kg (210 lb)             CONSTITUTIONAL: Well developed, and nourished, appropriately responsive and aware without distress.   EYES: Sclera non-icteric.   EARS, NOSE, MOUTH AND THROAT:  The oropharynx is clear. Oral mucosa is pink and moist.    Hearing is intact to voice.  NECK: Trachea is midline, and there is no jugular venous distension.  LYMPH NODES:  Lymph nodes in the neck are not appreciated. RESPIRATORY:   Normal respiratory effort without pathologic use of accessory muscles. CARDIOVASCULAR:  Well perfused.  GI: The abdomen is  soft, nontender, and nondistended. There were no palpable masses. I did not appreciate hepatosplenomegaly. There were normal bowel sounds. GU: Vicki Hill present as chaperone: Left breast: readily palpable mobile mass immediately underneath the inferior aspect of the areolar margin, discrete and well-defined.  No evidence of overlying  skin changes, nipple discharge, dimpling or puckering.  No other appreciable masses present. MUSCULOSKELETAL:  Symmetrical muscle tone appreciated in all four extremities.    SKIN: Skin turgor is normal. No pathologic skin lesions appreciated.  NEUROLOGIC:  Motor and sensation appear grossly normal.  Cranial nerves are grossly without defect. PSYCH:  Alert and oriented to person, place and time. Affect is appropriate for situation.  Data Reviewed I have personally reviewed what is currently available of the patient's imaging, recent labs and medical records.   Labs:     Latest Ref Rng &  Units 03/20/2017   12:45 AM  CBC  WBC 3.6 - 11.0 K/uL 7.9   Hemoglobin 12.0 - 16.0 g/dL 13.3   Hematocrit 35.0 - 47.0 % 40.3   Platelets 150 - 440 K/uL 391       Latest Ref Rng & Units 03/20/2017   12:45 AM  CMP  Glucose 65 - 99 mg/dL 89   BUN 6 - 20 mg/dL 10   Creatinine 0.44 - 1.00 mg/dL 0.64   Sodium 135 - 145 mmol/L 137   Potassium 3.5 - 5.1 mmol/L 3.5   Chloride 101 - 111 mmol/L 103   CO2 22 - 32 mmol/L 24   Calcium 8.9 - 10.3 mg/dL 9.4   Total Protein 6.5 - 8.1 g/dL 7.8   Total Bilirubin 0.3 - 1.2 mg/dL 0.6   Alkaline Phos 38 - 126 U/L 44   AST 15 - 41 U/L 24   ALT 14 - 54 U/L 16    SURGICAL PATHOLOGY  CASE: [id number]  PATIENT: Vicki Hill  Surgical Pathology Report   Specimen Submitted:  A. Breast, left   Clinical History: Oval solid mass favor benign FA. Ribbon-shaped clip  placed following ultrasound guided biopsy of LEFT breast at 1 o'clock.   DIAGNOSIS:  A. BREAST, LEFT AT 3:00, RETROAREOLAR AND 1 CM FROM THE NIPPLE;  ULTRASOUND-GUIDED CORE NEEDLE BIOPSY:  - MINUTE DISAGGREGATED FRAGMENTS OF BENIGN MAMMARY PARENCHYMA WITH  FIBROEPITHELIAL PROLIFERATION, FAVOR BENIGN FIBROADENOMA  - NEGATIVE FOR ATYPICAL PROLIFERATIVE BREAST DISEASE.   Imaging: Radiological images reviewed:  CLINICAL DATA:  27 year old female presenting for evaluation of a left breast mass that has been tender more recently. Patient was evaluated for this mass in 2016.   EXAM: ULTRASOUND OF THE LEFT BREAST   COMPARISON:  Previous exam(s).   FINDINGS: On physical exam, in the retroareolar left breast I feel a discrete mobile mass.   Targeted ultrasound is performed at the palpable site of concern in the left breast at 3 o'clock 1 cm from the nipple demonstrating an oval circumscribed hypoechoic mass measuring 4.0 x 2.2 x 3.8 cm, previously measuring 2.9 x 2.0 cm.   Targeted ultrasound of the left axilla demonstrates normal lymph nodes.   IMPRESSION: Indeterminate mass  in the left breast at 3 o'clock which has increased in size compared to 2016. While this most likely represents a fibroadenoma tissue sampling is recommended given interval growth.   RECOMMENDATION: Ultrasound-guided core needle biopsy x1 of the left breast.   I have discussed the findings and recommendations with the patient. If applicable, a reminder letter will be sent to the patient regarding the next appointment.   BI-RADS CATEGORY  4: Suspicious.     Electronically Signed   By: Audie Pinto M.D.   On: 01/18/2022 14:12 Within last 24 hrs: No results found.  ADDENDUM REPORT: 01/27/2022 15:49   ADDENDUM: PATHOLOGY revealed: A. BREAST, LEFT AT 3:00, RETROAREOLAR AND 1 CM  FROM THE NIPPLE; ULTRASOUND-GUIDED CORE NEEDLE BIOPSY: - MINUTE DISAGGREGATED FRAGMENTS OF BENIGN MAMMARY PARENCHYMA WITH FIBROEPITHELIAL PROLIFERATION, FAVOR BENIGN FIBROADENOMA - NEGATIVE FOR ATYPICAL PROLIFERATIVE BREAST DISEASE.   Pathology results are CONCORDANT with imaging findings, per Dr. Audie Pinto with possible excision recommended given growth and size (4.0).   Pathology results and recommendations were discussed with patient via telephone on 01/26/2022. Patient reported biopsy site doing well with no adverse symptoms, and only slight tenderness at the site. Post biopsy care instructions were reviewed, questions were answered and my direct phone number was provided. Patient was instructed to call [facility] for any additional questions or concerns related to biopsy site.   RECOMMENDATION: Surgical consultation for consideration of excision due to growth and size (4.0 cm). Request for surgical consultation relayed to Casper Harrison RN at [facility] by Electa Sniff RN on 01/27/2022.   Pathology results reported by Electa Sniff RN on 01/27/2022.     Electronically Signed   By: Audie Pinto M.D.   On: 01/27/2022 15:49    Addended by Tracey Harries, MD on 01/27/2022  5:49 PM  ADDENDUM REPORT: 01/25/2022 11:29   ADDENDUM: The biopsied mass in the left breast at 3 o'clock retroareolar.     Electronically Signed   By: Audie Pinto M.D.   On: 01/25/2022 11:29    Addended by Tracey Harries, MD on 01/25/2022  1:29 PM    Study Result  Narrative & Impression  CLINICAL DATA:  27 year old female presenting for biopsy of a mass in the left breast.   EXAM: ULTRASOUND GUIDED LEFT BREAST CORE NEEDLE BIOPSY   COMPARISON:  Previous exam(s).   PROCEDURE: I met with the patient and we discussed the procedure of ultrasound-guided biopsy, including benefits and alternatives. We discussed the high likelihood of a successful procedure. We discussed the risks of the procedure, including infection, bleeding, tissue injury, clip migration, and inadequate sampling. Informed written consent was given. The usual time-out protocol was performed immediately prior to the procedure.   Lesion quadrant: Upper outer quadrant   Using sterile technique and 1% Lidocaine as local anesthetic, under direct ultrasound visualization, a 14 gauge spring-loaded device was used to perform biopsy of a mass in the left breast at retroareolar 1 o'clock using a lateral approach. At the conclusion of the procedure a ribbon shaped tissue marker clip was deployed into the biopsy cavity. Follow up 2 view mammogram was performed and dictated separately.   IMPRESSION: Ultrasound guided biopsy of a mass in the left breast at retroareolar 1 o'clock. No apparent complications.   Electronically Signed: By: Audie Pinto M.D. On: 01/25/2022 11:08       Study Result  Narrative & Impression  CLINICAL DATA:  27 year old female presenting for evaluation of a left breast mass that has been tender more recently. Patient was evaluated for this mass in 2016.   EXAM: ULTRASOUND OF THE LEFT BREAST   COMPARISON:  Previous exam(s).   FINDINGS: On  physical exam, in the retroareolar left breast I feel a discrete mobile mass.   Targeted ultrasound is performed at the palpable site of concern in the left breast at 3 o'clock 1 cm from the nipple demonstrating an oval circumscribed hypoechoic mass measuring 4.0 x 2.2 x 3.8 cm, previously measuring 2.9 x 2.0 cm.   Targeted ultrasound of the left axilla demonstrates normal lymph nodes.   IMPRESSION: Indeterminate mass in the left breast at 3 o'clock which has increased in size  compared to 2016. While this most likely represents a fibroadenoma tissue sampling is recommended given interval growth.   RECOMMENDATION: Ultrasound-guided core needle biopsy x1 of the left breast.   I have discussed the findings and recommendations with the patient. If applicable, a reminder letter will be sent to the patient regarding the next appointment.   BI-RADS CATEGORY  4: Suspicious.     Electronically Signed   By: Audie Pinto M.D.   On: 01/18/2022 14:12    Assessment    Fibroadenoma left breast. 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  Risks and benefits above procedure discussed with patient in detail these include anesthesia, bleeding, infection, cosmetic change.  I believe she understands these are not all inclusive.  She understands she has the option of continuing observation of this lesion which is likely benign and of little threat.  She would like to proceed with removal. Risks excepted with questions answered.  No guarantees were expressed or implied. Face-to-face time spent with the patient and accompanying care providers(if present) was 30 minutes, with more than 50% of the time spent counseling, educating, and coordinating care of the patient.    These notes generated with voice recognition software. I  apologize for typographical errors.  Ronny Bacon M.D., FACS 02/02/2022, 1:15 PM

## 2022-02-02 ENCOUNTER — Telehealth: Payer: Self-pay | Admitting: Surgery

## 2022-02-02 ENCOUNTER — Ambulatory Visit: Payer: 59 | Admitting: Surgery

## 2022-02-02 ENCOUNTER — Ambulatory Visit: Payer: Self-pay | Admitting: Surgery

## 2022-02-02 ENCOUNTER — Other Ambulatory Visit: Payer: Self-pay

## 2022-02-02 ENCOUNTER — Encounter: Payer: Self-pay | Admitting: Surgery

## 2022-02-02 VITALS — BP 116/82 | HR 94 | Temp 98.9°F | Ht 64.0 in | Wt 210.0 lb

## 2022-02-02 DIAGNOSIS — N6342 Unspecified lump in left breast, subareolar: Secondary | ICD-10-CM

## 2022-02-02 NOTE — Telephone Encounter (Signed)
Patient has been advised of Pre-Admission date/time, and Surgery date at [facility].  Surgery Date: 02/24/22 Preadmission Testing Date: 02/15/22 (phone 8a-1p)  Patient has been made aware to call [phone], between 1-3:00pm the day before surgery, to find out what time to arrive for surgery.

## 2022-02-02 NOTE — Patient Instructions (Signed)
  Our surgery scheduler will call you within 24-48 hours to schedule your surgery. Please have the [place] surgery sheet available when speaking with her.   Fibroadenoma  A fibroadenoma is a lump (tumor) in the breast. The lump is benign. This means that it is not cancer. It may move under your skin when you touch it. This kind of lump can grow in one breast or in both breasts. What are the causes? The cause of this condition is not known. What increases the risk? Being a woman between the ages of 86 and 29. Being a woman of African American descent. What are the signs or symptoms? Some lumps are too small to be felt. If you can feel it, it may feel like a lump that is: Firm. Round. Smooth. Able to move around a bit. How is this treated? Regular breast exams are done to check for changes in the lump. In some cases, the lump may be removed if: It is large. It keeps growing. It causes pain or changes in the skin of the breast. A young girl has a lump. Lumps in young girls tend to grow over time. Follow these instructions at home: Breast exams Check your breasts at home as told by your doctor. Report any changes or concerns. Check for the following: The size of the lump. The look and feel of the skin of your breasts. The look and feel of your nipples.  General instructions Do not smoke or use any products that contain nicotine or tobacco. These can further increase your cancer risk. If you need help quitting, ask your doctor. Keep all follow-up visits. You will need breast exams on a regular basis. Contact a doctor if: The lump changes in size or feels different. The lump starts to be painful. You find a new lump. You have any changes in how the skin on your breast looks. You have any changes in your nipple, such as: Fluid leaking from your nipple. Redness around your nipple. Summary A fibroadenoma is a lump (tumor) in the breast. The lump is benign. This means that it is not  cancer. This may feel firm, round, or smooth, and it may move around a bit when touched. Some lumps are too small to be felt. Do breast exams at home. Watch for changes in the size of the lump. Contact your doctor if the lump grows bigger or starts to cause pain. Also, let your doctor know if you have any changes in your nipple or in how the skin on your breast looks. This information is not intended to replace advice given to you by your health care provider. Make sure you discuss any questions you have with your health care provider. Document Revised: 10/27/2019 Document Reviewed: 10/27/2019 Elsevier Patient Education  [place].

## 2022-02-15 ENCOUNTER — Other Ambulatory Visit: Payer: Self-pay

## 2022-02-15 ENCOUNTER — Encounter
Admission: RE | Admit: 2022-02-15 | Discharge: 2022-02-15 | Disposition: A | Discharge status: Home / Self Care | Payer: 59 | Source: Ambulatory Visit | Attending: Surgery | Admitting: Surgery

## 2022-02-15 DIAGNOSIS — Z01812 Encounter for preprocedural laboratory examination: Secondary | ICD-10-CM

## 2022-02-15 NOTE — Patient Instructions (Addendum)
Your procedure is scheduled on: 02/24/22 - Friday Report to the Registration Desk on the 1st floor of the [facility]. To find out your arrival time, please call [phone] between 1PM - 3PM on: 02/23/22 - Thursday If your arrival time is 6:00 am, do not arrive before that time as the [facility] entrance doors do not open until 6:00 am.  REMEMBER: Instructions that are not followed completely may result in serious medical risk, up to and including death; or upon the discretion of your surgeon and anesthesiologist your surgery may need to be rescheduled.  Do not eat food or drink any fluids after midnight the night before surgery.  No gum chewing or hard candies.  One week prior to surgery: Stop Anti-inflammatories (NSAIDS) such as Advil, Aleve, Ibuprofen, Motrin, Naproxen, Naprosyn and Aspirin based products such as Excedrin, Goody's Powder, BC Powder.  Stop ANY OVER THE COUNTER supplements until after surgery.  You may take Tylenol if needed for pain up until the day of surgery.  TAKE THESE MEDICATIONS THE MORNING OF SURGERY WITH A SIP OF WATER:  NONE  No Alcohol for 24 hours before or after surgery.  No Smoking including e-cigarettes for 24 hours before surgery.  No chewable tobacco products for at least 6 hours before surgery.  No nicotine patches on the day of surgery.  Do not use any "recreational" drugs for at least a week (preferably 2 weeks) before your surgery.  Please be advised that the combination of cocaine and anesthesia may have negative outcomes, up to and including death. If you test positive for cocaine, your surgery will be cancelled.  On the morning of surgery brush your teeth with toothpaste and water, you may rinse your mouth with mouthwash if you wish. Do not swallow any toothpaste or mouthwash.  Use CHG Soap or wipes as directed on instruction sheet.  Do not wear jewelry, make-up, hairpins, clips or nail polish.  Do not wear lotions, powders, or  perfumes.   Do not shave body hair from the neck down 48 hours before surgery.  Contact lenses, hearing aids and dentures may not be worn into surgery.  Do not bring valuables to the hospital. [facility] is not responsible for any missing/lost belongings or valuables.   Notify your doctor if there is any change in your medical condition (cold, fever, infection).  Wear comfortable clothing (specific to your surgery type) to the hospital.  After surgery, you can help prevent lung complications by doing breathing exercises.  Take deep breaths and cough every 1-2 hours. Your doctor may order a device called an Incentive Spirometer to help you take deep breaths. When coughing or sneezing, hold a pillow firmly against your incision with both hands. This is called "splinting." Doing this helps protect your incision. It also decreases belly discomfort.  If you are being admitted to the hospital overnight, leave your suitcase in the car. After surgery it may be brought to your room.  In case of increased patient census, it may be necessary for you, the patient, to continue your postoperative care in the Same Day Surgery department.  If you are being discharged the day of surgery, you will not be allowed to drive home. You will need a responsible individual to drive you home and stay with you for 24 hours after surgery.   If you are taking public transportation, you will need to have a responsible individual with you.  Please call the [facility] at [phone] if  you have any questions about these instructions.  Surgery Visitation Policy:  Patients undergoing a surgery or procedure may have two family members or support persons with them as long as the person is not COVID-19 positive or experiencing its symptoms.   Inpatient Visitation:    Visiting hours are 7 a.m. to 8 p.m. Up to four visitors are allowed at one time in a patient room. The visitors may rotate out with  other people during the day. One designated support person (adult) may remain overnight.  Due to an increase in RSV and influenza rates and associated hospitalizations, children ages 32 and under will not be able to visit patients in [facility].  Masks continue to be strongly recommended.     Preparing for Surgery with CHLORHEXIDINE GLUCONATE (CHG) Soap  Chlorhexidine Gluconate (CHG) Soap  o An antiseptic cleaner that kills germs and bonds with the skin to continue killing germs even after washing  o Used for showering the night before surgery and morning of surgery  Before surgery, you can play an important role by reducing the number of germs on your skin.  CHG (Chlorhexidine gluconate) soap is an antiseptic cleanser which kills germs and bonds with the skin to continue killing germs even after washing.  Please do not use if you have an allergy to CHG or antibacterial soaps. If your skin becomes reddened/irritated stop using the CHG.  1. Shower the NIGHT BEFORE SURGERY and the MORNING OF SURGERY with CHG soap.  2. If you choose to wash your hair, wash your hair first as usual with your normal shampoo.  3. After shampooing, rinse your hair and body thoroughly to remove the shampoo.  4. Use CHG as you would any other liquid soap. You can apply CHG directly to the skin and wash gently with a scrungie or a clean washcloth.  5. Apply the CHG soap to your body only from the neck down. Do not use on open wounds or open sores. Avoid contact with your eyes, ears, mouth, and genitals (private parts). Wash face and genitals (private parts) with your normal soap.  6. Wash thoroughly, paying special attention to the area where your surgery will be performed.  7. Thoroughly rinse your body with warm water.  8. Do not shower/wash with your normal soap after using and rinsing off the CHG soap.  9. Pat yourself dry with a clean towel.  10. Wear clean pajamas to bed the night  before surgery.  12. Place clean sheets on your bed the night of your first shower and do not sleep with pets.  13. Shower again with the CHG soap on the day of surgery prior to arriving at the hospital.  14. Do not apply any deodorants/lotions/powders.  15. Please wear clean clothes to the hospital.

## 2022-02-23 MED ORDER — LACTATED RINGERS IV SOLN: INTRAVENOUS | Status: DC

## 2022-02-23 MED ORDER — GABAPENTIN 300 MG PO CAPS: 300.0000 mg | ORAL_CAPSULE | ORAL | Status: AC

## 2022-02-23 MED ORDER — BUPIVACAINE LIPOSOME 1.3 % IJ SUSP: 20.0000 mL | Freq: Once | INTRAMUSCULAR | Status: DC

## 2022-02-23 MED ORDER — CHLORHEXIDINE GLUCONATE CLOTH 2 % EX PADS: 6.0000 | MEDICATED_PAD | Freq: Once | CUTANEOUS | Status: DC

## 2022-02-23 MED ORDER — ACETAMINOPHEN 500 MG PO TABS: 1000.0000 mg | ORAL_TABLET | ORAL | Status: AC

## 2022-02-23 MED ORDER — FAMOTIDINE 20 MG PO TABS: 20.0000 mg | ORAL_TABLET | Freq: Once | ORAL | Status: AC

## 2022-02-23 MED ORDER — ORAL CARE MOUTH RINSE: 15.0000 mL | Freq: Once | OROMUCOSAL | Status: AC

## 2022-02-23 MED ORDER — CHLORHEXIDINE GLUCONATE 0.12 % MT SOLN: 15.0000 mL | Freq: Once | OROMUCOSAL | Status: AC

## 2022-02-23 MED ORDER — CELECOXIB 200 MG PO CAPS: 200.0000 mg | ORAL_CAPSULE | ORAL | Status: AC

## 2022-02-24 ENCOUNTER — Other Ambulatory Visit: Payer: Self-pay

## 2022-02-24 ENCOUNTER — Encounter: Payer: Self-pay | Admitting: Surgery

## 2022-02-24 ENCOUNTER — Encounter
Admission: RE | Disposition: A | Discharge status: Home / Self Care | Payer: Self-pay | Source: Home / Self Care | Attending: Surgery

## 2022-02-24 ENCOUNTER — Ambulatory Visit: Payer: 59 | Admitting: Certified Registered"

## 2022-02-24 ENCOUNTER — Ambulatory Visit
Admission: RE | Admit: 2022-02-24 | Discharge: 2022-02-24 | Disposition: A | Discharge status: Home / Self Care | Payer: 59 | Attending: Surgery | Admitting: Surgery

## 2022-02-24 DIAGNOSIS — L918 Other hypertrophic disorders of the skin: Secondary | ICD-10-CM | POA: Diagnosis not present

## 2022-02-24 DIAGNOSIS — Z8619 Personal history of other infectious and parasitic diseases: Secondary | ICD-10-CM | POA: Diagnosis not present

## 2022-02-24 DIAGNOSIS — N6342 Unspecified lump in left breast, subareolar: Secondary | ICD-10-CM | POA: Diagnosis present

## 2022-02-24 DIAGNOSIS — D242 Benign neoplasm of left breast: Secondary | ICD-10-CM | POA: Diagnosis not present

## 2022-02-24 DIAGNOSIS — Z01812 Encounter for preprocedural laboratory examination: Secondary | ICD-10-CM

## 2022-02-24 HISTORY — PX: EXCISION OF BREAST BIOPSY: SHX5822

## 2022-02-24 LAB — POCT PREGNANCY, URINE: Preg Test, Ur: NEGATIVE

## 2022-02-24 SURGERY — EXCISION OF BREAST BIOPSY
Anesthesia: General | Laterality: Left

## 2022-02-24 MED ORDER — PROPOFOL 1000 MG/100ML IV EMUL
INTRAVENOUS | Status: AC
  Filled 2022-02-24: qty 100

## 2022-02-24 MED ORDER — BUPIVACAINE HCL (PF) 0.25 % IJ SOLN
INTRAMUSCULAR | Status: AC
  Filled 2022-02-24: qty 30

## 2022-02-24 MED ORDER — PROPOFOL 10 MG/ML IV BOLUS
INTRAVENOUS | Status: DC | PRN
  Administered 2022-02-24: 150 mg via INTRAVENOUS
  Administered 2022-02-24: 150 ug/kg/min via INTRAVENOUS

## 2022-02-24 MED ORDER — CELECOXIB 200 MG PO CAPS
ORAL_CAPSULE | ORAL | Status: AC
  Administered 2022-02-24: 200 mg via ORAL
  Filled 2022-02-24: qty 1

## 2022-02-24 MED ORDER — PHENYLEPHRINE HCL (PRESSORS) 10 MG/ML IV SOLN
INTRAVENOUS | Status: DC | PRN
  Administered 2022-02-24 (×2): 160 ug via INTRAVENOUS
  Administered 2022-02-24: 100 ug via INTRAVENOUS

## 2022-02-24 MED ORDER — FENTANYL CITRATE (PF) 100 MCG/2ML IJ SOLN
INTRAMUSCULAR | Status: AC
  Filled 2022-02-24: qty 2

## 2022-02-24 MED ORDER — HYDROCODONE-ACETAMINOPHEN 5-325 MG PO TABS: 1.0000 | ORAL_TABLET | Freq: Four times a day (QID) | ORAL | 0 refills | Status: DC | PRN

## 2022-02-24 MED ORDER — MIDAZOLAM HCL 2 MG/2ML IJ SOLN
INTRAMUSCULAR | Status: DC | PRN
  Administered 2022-02-24: 2 mg via INTRAVENOUS

## 2022-02-24 MED ORDER — HYDROMORPHONE HCL 1 MG/ML IJ SOLN
INTRAMUSCULAR | Status: DC | PRN
  Administered 2022-02-24: 1 mg via INTRAVENOUS

## 2022-02-24 MED ORDER — ONDANSETRON HCL 4 MG/2ML IJ SOLN
INTRAMUSCULAR | Status: DC | PRN
  Administered 2022-02-24: 4 mg via INTRAVENOUS

## 2022-02-24 MED ORDER — DEXMEDETOMIDINE HCL IN NACL 80 MCG/20ML IV SOLN
INTRAVENOUS | Status: DC | PRN
  Administered 2022-02-24: 12 ug via BUCCAL

## 2022-02-24 MED ORDER — FENTANYL CITRATE (PF) 100 MCG/2ML IJ SOLN
INTRAMUSCULAR | Status: DC | PRN
  Administered 2022-02-24: 50 ug via INTRAVENOUS

## 2022-02-24 MED ORDER — FAMOTIDINE 20 MG PO TABS
ORAL_TABLET | ORAL | Status: AC
  Administered 2022-02-24: 20 mg via ORAL
  Filled 2022-02-24: qty 1

## 2022-02-24 MED ORDER — BUPIVACAINE LIPOSOME 1.3 % IJ SUSP
INTRAMUSCULAR | Status: AC
  Filled 2022-02-24: qty 20

## 2022-02-24 MED ORDER — EPINEPHRINE PF 1 MG/ML IJ SOLN
INTRAMUSCULAR | Status: AC
  Filled 2022-02-24: qty 1

## 2022-02-24 MED ORDER — HYDROMORPHONE HCL 1 MG/ML IJ SOLN
INTRAMUSCULAR | Status: AC
  Filled 2022-02-24: qty 1

## 2022-02-24 MED ORDER — ACETAMINOPHEN 500 MG PO TABS
ORAL_TABLET | ORAL | Status: AC
  Administered 2022-02-24: 1000 mg via ORAL
  Filled 2022-02-24: qty 2

## 2022-02-24 MED ORDER — MIDAZOLAM HCL 2 MG/2ML IJ SOLN
INTRAMUSCULAR | Status: AC
  Filled 2022-02-24: qty 2

## 2022-02-24 MED ORDER — DEXAMETHASONE SODIUM PHOSPHATE 10 MG/ML IJ SOLN
INTRAMUSCULAR | Status: DC | PRN
  Administered 2022-02-24: 10 mg via INTRAVENOUS

## 2022-02-24 MED ORDER — LIDOCAINE HCL (CARDIAC) PF 100 MG/5ML IV SOSY
PREFILLED_SYRINGE | INTRAVENOUS | Status: DC | PRN
  Administered 2022-02-24: 100 mg via INTRAVENOUS

## 2022-02-24 MED ORDER — GABAPENTIN 300 MG PO CAPS
ORAL_CAPSULE | ORAL | Status: AC
  Administered 2022-02-24: 300 mg via ORAL
  Filled 2022-02-24: qty 1

## 2022-02-24 MED ORDER — BUPIVACAINE-EPINEPHRINE 0.25% -1:200000 IJ SOLN
INTRAMUSCULAR | Status: DC | PRN
  Administered 2022-02-24: 10 mL

## 2022-02-24 MED ORDER — CHLORHEXIDINE GLUCONATE 0.12 % MT SOLN
OROMUCOSAL | Status: AC
  Administered 2022-02-24: 15 mL via OROMUCOSAL
  Filled 2022-02-24: qty 15

## 2022-02-24 SURGICAL SUPPLY — 40 items
ADH SKN CLS APL DERMABOND .7 (GAUZE/BANDAGES/DRESSINGS) ×1
APL PRP STRL LF DISP 70% ISPRP (MISCELLANEOUS) ×1
APPLIER CLIP 9.375 SM OPEN (CLIP)
APR CLP SM 9.3 20 MLT OPN (CLIP)
BLADE SURG 15 STRL LF DISP TIS (BLADE) ×1 IMPLANT
BLADE SURG 15 STRL SS (BLADE) ×1
CHLORAPREP W/TINT 26 (MISCELLANEOUS) ×1 IMPLANT
CLIP APPLIE 9.375 SM OPEN (CLIP) IMPLANT
CNTNR URN SCR LID CUP LEK RST (MISCELLANEOUS) IMPLANT
CONT SPEC 4OZ STRL OR WHT (MISCELLANEOUS)
DERMABOND ADVANCED .7 DNX12 (GAUZE/BANDAGES/DRESSINGS) ×1 IMPLANT
DEVICE DUBIN SPECIMEN MAMMOGRA (MISCELLANEOUS) IMPLANT
DRAPE LAPAROTOMY TRNSV 106X77 (MISCELLANEOUS) ×1 IMPLANT
ELECT CAUTERY BLADE TIP 2.5 (TIP) ×1
ELECT REM PT RETURN 9FT ADLT (ELECTROSURGICAL) ×1
ELECTRODE CAUTERY BLDE TIP 2.5 (TIP) ×1 IMPLANT
ELECTRODE REM PT RTRN 9FT ADLT (ELECTROSURGICAL) ×1 IMPLANT
GAUZE 4X4 16PLY ~~LOC~~+RFID DBL (SPONGE) ×1 IMPLANT
GLOVE ORTHO TXT STRL SZ7.5 (GLOVE) ×1 IMPLANT
GOWN STRL REUS W/ TWL LRG LVL3 (GOWN DISPOSABLE) ×1 IMPLANT
GOWN STRL REUS W/ TWL XL LVL3 (GOWN DISPOSABLE) ×1 IMPLANT
GOWN STRL REUS W/TWL LRG LVL3 (GOWN DISPOSABLE) ×1
GOWN STRL REUS W/TWL XL LVL3 (GOWN DISPOSABLE) ×1
KIT MARKER MARGIN INK (KITS) IMPLANT
KIT TURNOVER KIT A (KITS) ×1 IMPLANT
MANIFOLD NEPTUNE II (INSTRUMENTS) ×1 IMPLANT
MARKER MARGIN CORRECT CLIP (MARKER) IMPLANT
NEEDLE HYPO 22GX1.5 SAFETY (NEEDLE) ×1 IMPLANT
PACK BASIN MINOR ARMC (MISCELLANEOUS) ×1 IMPLANT
SPIKE FLUID TRANSFER (MISCELLANEOUS) ×1 IMPLANT
SUT MNCRL 4-0 (SUTURE) ×1
SUT MNCRL 4-0 27XMFL (SUTURE) ×1
SUT VIC AB 3-0 SH 27 (SUTURE) ×1
SUT VIC AB 3-0 SH 27X BRD (SUTURE) ×1 IMPLANT
SUTURE MNCRL 4-0 27XMF (SUTURE) ×1 IMPLANT
SYR 10ML LL (SYRINGE) ×1 IMPLANT
TRAP FLUID SMOKE EVACUATOR (MISCELLANEOUS) ×1 IMPLANT
TRAP NEPTUNE SPECIMEN COLLECT (MISCELLANEOUS) ×1 IMPLANT
WATER STERILE IRR 1000ML POUR (IV SOLUTION) ×1 IMPLANT
WATER STERILE IRR 500ML POUR (IV SOLUTION) ×1 IMPLANT

## 2022-02-24 NOTE — Anesthesia Postprocedure Evaluation (Signed)
Anesthesia Post Note  Patient: LOLITTA HANAS  Procedure(s) Performed: EXCISION OF BREAST BIOPSY (Left)  Patient location during evaluation: PACU Anesthesia Type: General Level of consciousness: awake and alert Pain management: pain level controlled Vital Signs Assessment: post-procedure vital signs reviewed and stable Respiratory status: spontaneous breathing, nonlabored ventilation and respiratory function stable Cardiovascular status: blood pressure returned to baseline and stable Postop Assessment: no apparent nausea or vomiting Anesthetic complications: no   There were no known notable events for this encounter.   Last Vitals:  Vitals:   02/24/22 0851 02/24/22 0906  BP: 124/83 123/78  Pulse: 86 71  Resp: 15 16  Temp: (!) 36.1 C (!) 36.3 C  SpO2: 95% 96%    Last Pain:  Vitals:   02/24/22 0906  TempSrc: Temporal  PainSc: 0-No pain                 [place] Ouch

## 2022-02-24 NOTE — Transfer of Care (Signed)
Immediate Anesthesia Transfer of Care Note  Patient: Vicki Hill  Procedure(s) Performed: EXCISION OF BREAST BIOPSY (Left)  Patient Location: PACU  Anesthesia Type:General  Level of Consciousness: awake, alert , and oriented  Airway & Oxygen Therapy: Patient Spontanous Breathing and Patient connected to face mask oxygen  Post-op Assessment: Report given to RN and Post -op Vital signs reviewed and stable  Post vital signs: Reviewed and stable  Last Vitals:  Vitals Value Taken Time  BP 114/98 02/24/22 0819  Temp 36.5 C 02/24/22 0819  Pulse 93 02/24/22 0824  Resp 14 02/24/22 0824  SpO2 98 % 02/24/22 0824  Vitals shown include unvalidated device data.  Last Pain:  Vitals:   02/24/22 0819  TempSrc:   PainSc: Asleep         Complications: No notable events documented.

## 2022-02-24 NOTE — Anesthesia Procedure Notes (Signed)
Procedure Name: LMA Insertion Date/Time: 02/24/2022 7:42 AM  Performed by: Patience Musca., CRNAPre-anesthesia Checklist: Patient identified, Patient being monitored, Timeout performed, Emergency Drugs available and Suction available Patient Re-evaluated:Patient Re-evaluated prior to induction Oxygen Delivery Method: Circle system utilized Preoxygenation: Pre-oxygenation with 100% oxygen Induction Type: IV induction Ventilation: Mask ventilation without difficulty LMA: LMA inserted LMA Size: 4.0 Tube type: Oral Number of attempts: 1 Placement Confirmation: positive ETCO2 and breath sounds checked- equal and bilateral Tube secured with: Tape Dental Injury: Teeth and Oropharynx as per pre-operative assessment

## 2022-02-24 NOTE — Op Note (Signed)
Left breast, excisional biopsy  Pre-operative Diagnosis: left breast mass  Post-operative Diagnosis: same, c/w fibroadenoma  Surgeon: Ronny Bacon, M.D., FACS  Anesthesia: General LMA   Findings: as expected.   Estimated Blood Loss: 5 mL         Specimens: Well encapsulated mass from central left breast          Complications: none              Procedure Details  The patient was seen again in the Holding Room. The benefits, complications, treatment options, and expected outcomes were discussed with the patient. The risks of bleeding, infection, recurrence of symptoms, failure to resolve symptoms, unanticipated injury, prosthetic placement, prosthetic infection, any of which could require further surgery were reviewed with the patient. The likelihood of improving the patient's symptoms with return to their baseline status is expected.  The patient and/or family concurred with the proposed plan, giving informed consent.  The patient was taken to Operating Room, identified and the procedure verified.    Prior to the induction of general anesthesia, antibiotic prophylaxis was not administered. VTE prophylaxis was in place. GA was then administered and tolerated well. After the induction, the patient was positioned in the supine position and the left breast was prepped with  Chloraprep and draped in the sterile fashion.  A Time Out was held and the above information confirmed.  Local infiltration of quarter percent Marcaine with epinephrine was utilized to infiltrate an inframammary crease skin tag.  This was then sharply excised.  Hemostasis obtained with pressure and additional local administration of epinephrine.  The outer lower quadrant circumareolar transition was infiltrated with the same anesthetic, and a circumareolar incision was made in that area.  Sharp dissection with electrosurgery was utilized to arrive at the well defined subareolar mass.  This was then essentially shelled  out/enucleated from the adjacent soft tissues as there was a very well-defined plane present.  Hemostasis was assured.  And the incision was then closed with interrupted dermal sutures of 3-0 Vicryl, and a running 4-0 Monocryl subcuticular.  The skin was sealed with Dermabond.  Additional local anesthetic was then infiltrated into the biopsy cavity.  Patient tolerated procedure well.      Ronny Bacon M.D., [facility] 02/24/2022 8:26 AM

## 2022-02-24 NOTE — Discharge Instructions (Signed)

## 2022-02-24 NOTE — Anesthesia Preprocedure Evaluation (Signed)
Anesthesia Evaluation  Patient identified by MRN, date of birth, ID band Patient awake    Reviewed: Allergy & Precautions, H&P , NPO status , Patient's Chart, lab work & pertinent test results  Airway Mallampati: II  TM Distance: >3 FB Neck ROM: full    Dental no notable dental hx.    Pulmonary neg pulmonary ROS   Pulmonary exam normal        Cardiovascular negative cardio ROS Normal cardiovascular exam     Neuro/Psych negative neurological ROS  negative psych ROS   GI/Hepatic negative GI ROS, Neg liver ROS,,,  Endo/Other  negative endocrine ROS    Renal/GU      Musculoskeletal   Abdominal   Peds  Hematology negative hematology ROS (+)   Anesthesia Other Findings left breast subareolar mass  Past Medical History: No date: BV (bacterial vaginosis)     Comment:  Recurrent No date: Migraine  Past Surgical History: 01/25/2022: BREAST BIOPSY; Left     Comment:  [place] LT BREAST BX W LOC DEV 1ST LESION IMG BX [place]               GUIDE 01/25/2022 [facility]  BMI    Body Mass Index: 35.19 kg/m      Reproductive/Obstetrics negative OB ROS                             Anesthesia Physical Anesthesia Plan  ASA: 2  Anesthesia Plan: General LMA   Post-op Pain Management: Tylenol PO (pre-op)*, Celebrex PO (pre-op)* and Gabapentin PO (pre-op)*   Induction: Intravenous  PONV Risk Score and Plan: 3 and Dexamethasone, Ondansetron and Midazolam  Airway Management Planned: LMA  Additional Equipment:   Intra-op Plan:   Post-operative Plan:   Informed Consent:      Dental Advisory Given  Plan Discussed with: Anesthesiologist, CRNA and Surgeon  Anesthesia Plan Comments:        Anesthesia Quick Evaluation

## 2022-02-24 NOTE — Interval H&P Note (Signed)
History and Physical Interval Note:  02/24/2022 7:21 AM  Vicki Hill  has presented today for surgery, with the diagnosis of left breast subareolar mass.  The various methods of treatment have been discussed with the patient and family. After consideration of risks, benefits and other options for treatment, the patient has consented to  Procedure(s): EXCISION OF BREAST BIOPSY (Left) as a surgical intervention.  The patient's history has been reviewed, patient examined, no change in status, stable for surgery.  I have reviewed the patient's chart and labs.  Questions were answered to the patient's satisfaction.   The left side is marked for excisional biopsy.   Ronny Bacon

## 2022-02-25 ENCOUNTER — Encounter: Payer: Self-pay | Admitting: Surgery

## 2022-02-27 ENCOUNTER — Telehealth: Payer: Self-pay

## 2022-02-27 LAB — SURGICAL PATHOLOGY

## 2022-02-27 NOTE — Telephone Encounter (Signed)
Excision of breast biopsy- patient complains of pain- taking hydrocodone-she was instructed to try ibuprofen.Denies redness. She states the area look's well  good just having more discomfort. We also discussed moving her bowels- She has not had a bowel  movement since before surgery and we discussed trying Miralax until bowel movements become more regular.

## 2022-03-08 NOTE — Progress Notes (Unsigned)
[facility] POST-OP OFFICE VISIT  03/08/2022  HPI: Vicki Hill is a 27 y.o. female 13 days s/p excision of left breast fibroadenoma.  She presents today with no complaints; she was unaware of her path report.  And we reassured her today that all was benign.  Vital signs: LMP 01/23/2022 (Approximate)    Physical Exam: Constitutional: She appears well  Skin: Circumareolar incision appears to be intact, healing well under symptoms like the disrupted Dermabond residue.  There is no evidence of induration or tenderness present.  No evidence of hematoma.  Assessment/Plan: This is a 27 y.o. female 13 days s/p excisional biopsy of left breast fibroadenoma.  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  She can follow-up with [place] as needed.   Ronny Bacon M.D., FACS 03/08/2022, 9:16 PM

## 2022-03-09 ENCOUNTER — Encounter: Payer: Self-pay | Admitting: Surgery

## 2022-03-09 ENCOUNTER — Ambulatory Visit (INDEPENDENT_AMBULATORY_CARE_PROVIDER_SITE_OTHER): Payer: 59 | Admitting: Surgery

## 2022-03-09 VITALS — BP 114/78 | HR 81 | Temp 98.0°F | Ht 65.0 in | Wt 206.0 lb

## 2022-03-09 DIAGNOSIS — D242 Benign neoplasm of left breast: Secondary | ICD-10-CM

## 2022-03-09 NOTE — Patient Instructions (Signed)
Follow-up with our office as needed.  Please call and ask to speak with a nurse if you develop questions or concerns.    No need for mammogram(until standard age) unless any new problems develop.

## 2022-03-27 ENCOUNTER — Other Ambulatory Visit: Payer: Self-pay | Admitting: Family Medicine

## 2022-03-27 DIAGNOSIS — H6993 Unspecified Eustachian tube disorder, bilateral: Secondary | ICD-10-CM

## 2022-03-28 NOTE — Telephone Encounter (Signed)
Requested Prescriptions  Pending Prescriptions Disp Refills   montelukast (SINGULAIR) 10 MG tablet [Pharmacy Med Name: MONTELUKAST SOD 10 MG TABLET] 90 tablet 1    Sig: TAKE 1 TABLET BY MOUTH EVERYDAY AT BEDTIME     Pulmonology:  Leukotriene Inhibitors Passed - 03/27/2022  1:51 AM      Passed - Valid encounter within last 12 months    Recent Outpatient Visits           2 months ago Urticaria of entire body   [facility] [place], Deanna C, MD   2 months ago Flu-like symptoms   [facility] [place], Deanna C, MD   3 months ago Sore throat   [facility], [facility]   1 year ago Delayed period   [place] [facility], MD

## 2022-05-31 ENCOUNTER — Ambulatory Visit: Payer: Self-pay | Admitting: *Deleted

## 2022-05-31 NOTE — Telephone Encounter (Signed)
  Chief Complaint: medication request: chronic BV Symptoms: discharge with odor Frequency: started Monday Pertinent Negatives: Patient denies fever, itching, vaginal bleeding, pain with urination, injury to genital area, vaginal foreign body Disposition: [] ED /[] Urgent Care (no appt availability in office) / [] Appointment(In office/virtual)/ []  [place] [facility]/ [] Home Care/ [x] Refused Recommended Disposition /[]  Mobile Bus/ []  Follow-up with PCP Additional Notes: Patient is requesting medication for chronic BV- she states her provider is aware and this normally happens after a period. Patient states she is not sexually active. Patient is requesting medication because she states she is working and does not get off until 5pm. Patient request alternative to metronidazole- she states it makes her sick and she is also requesting yeast treatment for after antibiotic. Patient states [facility]. Patient advised I would send her request- but her provider may require a visit- virtual/in office.

## 2022-05-31 NOTE — Telephone Encounter (Signed)
Summary: Medication for BV/Yeast   Patient states that she has recurrent BV and yeast infections. Patient would like to know if provider can send something in.       Reason for Disposition  Bad smelling vaginal discharge  Answer Assessment - Initial Assessment Questions 1. DISCHARGE: "Describe the discharge." (e.g., white, yellow, green, gray, foamy, cottage cheese-like)     Milky discharge 2. ODOR: "Is there a bad odor?"     Yes- fishy 3. ONSET: "When did the discharge begin?"     Monday after her cycle 4. RASH: "Is there a rash in the genital area?" If Yes, ask: "Describe it." (e.g., redness, blisters, sores, bumps)     no 5. ABDOMEN PAIN: "Are you having any abdomen pain?" If Yes, ask: "What does it feel like? " (e.g., crampy, dull, intermittent, constant)      no 6. ABDOMEN PAIN SEVERITY: If present, ask: "How bad is it?" (e.g., Scale 1-10; mild, moderate, or severe)   - MILD (1-3): Doesn't interfere with normal activities, abdomen soft and not tender to touch.    - MODERATE (4-7): Interferes with normal activities or awakens from sleep, abdomen tender to touch.    - SEVERE (8-10): Excruciating pain, doubled over, unable to do any normal activities. (R/O peritonitis)      na 7. CAUSE: "What do you think is causing the discharge?" "Have you had the same problem before? What happened then?"     Patient states she has chronic BV- after cycle 8. OTHER SYMPTOMS: "Do you have any other symptoms?" (e.g., fever, itching, vaginal bleeding, pain with urination, injury to genital area, vaginal foreign body)     irritation 9. PREGNANCY: "Is there any chance you are pregnant?" "When was your last menstrual period?"     Not SA- 05/26/22- ended 5/20  Protocols used: Vaginal Discharge-A-AH

## 2022-06-01 ENCOUNTER — Encounter: Payer: Self-pay | Admitting: Family Medicine

## 2022-06-02 ENCOUNTER — Telehealth: Payer: 59 | Admitting: Family Medicine

## 2022-06-02 DIAGNOSIS — N760 Acute vaginitis: Secondary | ICD-10-CM

## 2022-06-02 DIAGNOSIS — B9689 Other specified bacterial agents as the cause of diseases classified elsewhere: Secondary | ICD-10-CM

## 2022-06-02 DIAGNOSIS — B3731 Acute candidiasis of vulva and vagina: Secondary | ICD-10-CM | POA: Diagnosis not present

## 2022-06-02 MED ORDER — CLINDAMYCIN HCL 300 MG PO CAPS: 300.0000 mg | ORAL_CAPSULE | Freq: Two times a day (BID) | ORAL | 0 refills | Status: AC

## 2022-06-02 MED ORDER — FLUCONAZOLE 150 MG PO TABS: 150.0000 mg | ORAL_TABLET | Freq: Every day | ORAL | 0 refills | Status: DC

## 2022-06-02 NOTE — Patient Instructions (Signed)
Vicki Hill, thank you for joining Freddy Finner, NP for today's virtual visit.  While this provider is not your primary care provider (PCP), if your PCP is located in our provider database this encounter information will be shared with them immediately following your visit.   A [place] MyChart account gives you access to today's visit and all your visits, tests, and labs performed at [facility] " click here if you don't have a [place] MyChart account or go to [email or web address]  Consent: (Patient) Vicki Hill provided verbal consent for this virtual visit at the beginning of the encounter.  Current Medications:  Current Outpatient Medications:    clindamycin (CLEOCIN) 300 MG capsule, Take 1 capsule (300 mg total) by mouth in the morning and at bedtime for 7 days., Disp: 14 capsule, Rfl: 0   fluconazole (DIFLUCAN) 150 MG tablet, Take 1 tablet (150 mg total) by mouth daily., Disp: 1 tablet, Rfl: 0   ibuprofen (ADVIL) 200 MG tablet, Take 400 mg by mouth every 6 (six) hours as needed., Disp: , Rfl:    Medications ordered in this encounter:  Meds ordered this encounter  Medications   fluconazole (DIFLUCAN) 150 MG tablet    Sig: Take 1 tablet (150 mg total) by mouth daily.    Dispense:  1 tablet    Refill:  0    Order Specific Question:   Supervising Provider    Answer:   Merrilee Jansky [[id number]]   clindamycin (CLEOCIN) 300 MG capsule    Sig: Take 1 capsule (300 mg total) by mouth in the morning and at bedtime for 7 days.    Dispense:  14 capsule    Refill:  0    Order Specific Question:   Supervising Provider    Answer:   Merrilee Jansky [id number]     *If you need refills on other medications prior to your next appointment, please contact your pharmacy*  Follow-Up: Call back or seek an in-person evaluation if the symptoms worsen or if the condition fails to improve as anticipated.  [facility] [phone]  Other  Instructions  Healthy vaginal hygiene practices   -  Avoid sleeper pajamas. Nightgowns allow air to circulate.  Sleep without underpants whenever possible.  -  Wear cotton underpants during the day. Double-rinse underwear after washing to avoid residual irritants. Do not use fabric softeners for underwear and swimsuits.  - Avoid tights, leotards, leggings, "skinny" jeans, and other tight-fitting clothing. Skirts and loose-fitting pants allow air to circulate.  - Avoid pantyliners.  Instead use tampons or cotton pads.  - Use the restroom after intercourse to help prevent UTI's  - Daily warm bathing is helpful:     - Soak in clean water (no soap) for 10 to 15 minutes. Adding vinegar or baking soda to the water has not been specifically studied and may not be better than clean water alone.      - Use soap to wash regions other than the genital area just before getting out of the tub. Limit use of any soap on genital areas. Use fragance-free soaps.     - Rinse the genital area well and gently pat dry.  Don't rub.  Hair dryer to assist with drying can be used only if on cool setting.     - Do not use bubble baths or perfumed soaps.  - Do not use any feminine sprays, douches or powders.  These contain chemicals that will irritate  the skin.  - If the genital area is tender or swollen, cool compresses may relieve the discomfort. Unscented wet wipes can be used instead of toilet paper for wiping.   - Emollients, such as Vaseline, may help protect skin and can be applied to the irritated area.  - Always remember to wipe front-to-back after bowel movements. Pat dry after urination.  - Do not sit in wet swimsuits for long periods of time after swimming     If you have been instructed to have an in-person evaluation today at a local Urgent Care facility, please use the link below. It will take you to a list of all of our available [place] [facility], including address, phone number and  hours of operation. Please do not delay care.  [facility]  If you or a family member do not have a primary care provider, use the link below to schedule a visit and establish care. When you choose a [place] primary care physician or advanced practice provider, you gain a long-term partner in health. Find a Primary Care Provider  Learn more about 's in-office and virtual care options:  - Get Care Now

## 2022-06-02 NOTE — Progress Notes (Signed)
Virtual Visit Consent   Vicki Hill, you are scheduled for a virtual visit with a [facility] provider today. Just as with appointments in the office, your consent must be obtained to participate. Your consent will be active for this visit and any virtual visit you may have with one of our providers in the next 365 days. If you have a MyChart account, a copy of this consent can be sent to you electronically.  As this is a virtual visit, video technology does not allow for your provider to perform a traditional examination. This may limit your provider's ability to fully assess your condition. If your provider identifies any concerns that need to be evaluated in person or the need to arrange testing (such as labs, EKG, etc.), we will make arrangements to do so. Although advances in technology are sophisticated, we cannot ensure that it will always work on either your end or our end. If the connection with a video visit is poor, the visit may have to be switched to a telephone visit. With either a video or telephone visit, we are not always able to ensure that we have a secure connection.  By engaging in this virtual visit, you consent to the provision of healthcare and authorize for your insurance to be billed (if applicable) for the services provided during this visit. Depending on your insurance coverage, you may receive a charge related to this service.  I need to obtain your verbal consent now. Are you willing to proceed with your visit today? Vicki Hill has provided verbal consent on 06/02/2022 for a virtual visit (video or telephone). Freddy Finner, NP  Date: 06/02/2022 10:19 AM  Virtual Visit via Video Note   I, Freddy Finner, connected with  Vicki Hill  ([id number], 03-Jun-1995) on 06/02/22 at 10:15 AM EDT by a video-enabled telemedicine application and verified that I am speaking with the correct person using two identifiers.  Location: Patient: Virtual Visit Location  Patient: Home Provider: Virtual Visit Location Provider: Home Office   I discussed the limitations of evaluation and management by telemedicine and the availability of in person appointments. The patient expressed understanding and agreed to proceed.    History of Present Illness: Vicki Hill is a 27 y.o. who identifies as a female who was assigned female at birth, and is being seen today for vaginal discharge.  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  Resting comfortably  at home.  Head is normocephalic, atraumatic.  No labored breathing.  Speech is clear and coherent with logical content.  Patient is alert and oriented at baseline.    Assessment and Plan:  1. BV (bacterial vaginosis)  - clindamycin (CLEOCIN) 300 MG capsule; Take 1 capsule (300 mg total) by mouth in  the morning and at bedtime for 7 days.  Dispense: 14 capsule; Refill: 0  2. Yeast vaginitis  - fluconazole (DIFLUCAN) 150 MG tablet; Take 1 tablet (150 mg total) by mouth daily.  Dispense: 1 tablet; Refill: 0  Healthy vaginal hygiene practices on AVS Boric acid encouraged once treated  Reviewed side effects, risks and benefits of medication.    Patient acknowledged  agreement and understanding of the plan.   Past Medical, Surgical, Social History, Allergies, and Medications have been Reviewed.    Follow Up Instructions: I discussed the assessment and treatment plan with the patient. The patient was provided an opportunity to ask questions and all were answered. The patient agreed with the plan and demonstrated an understanding of the instructions.  A copy of instructions were sent to the patient via MyChart unless otherwise noted below.   The patient was advised to call back or seek an in-person evaluation if the symptoms worsen or if the condition fails to improve as anticipated.  Time:  I spent 8 minutes with the patient via telehealth technology discussing the above problems/concerns.    Freddy Finner, NP

## 2022-06-02 NOTE — Telephone Encounter (Signed)
Patient informed via my chart that she will need to be seen in order to have prescription called in.  - Vicki Hill

## 2022-06-10 ENCOUNTER — Other Ambulatory Visit: Payer: Self-pay | Admitting: Family Medicine

## 2022-06-10 DIAGNOSIS — J0110 Acute frontal sinusitis, unspecified: Secondary | ICD-10-CM

## 2022-06-12 NOTE — Telephone Encounter (Signed)
Unable to refill per protocol, Rx expired. Discontinued 01/12/22, completed course.  Requested Prescriptions  Pending Prescriptions Disp Refills   doxycycline (VIBRA-TABS) 100 MG tablet [Pharmacy Med Name: DOXYCYCLINE HYCLATE 100 MG TAB] 20 tablet 0    Sig: TAKE 1 TABLET BY MOUTH TWICE A DAY     Off-Protocol Failed - 06/10/2022  8:40 AM      Failed - Medication not assigned to a protocol, review manually.      Passed - Valid encounter within last 12 months    Recent Outpatient Visits           5 months ago Urticaria of entire body   [facility], [facility]   5 months ago Flu-like symptoms   [facility], [facility]   6 months ago Sore throat   [facility]   1 year ago Delayed period   [facility]

## 2022-06-29 ENCOUNTER — Encounter: Payer: Self-pay | Admitting: Family Medicine

## 2022-06-29 ENCOUNTER — Ambulatory Visit: Payer: BLUE CROSS/BLUE SHIELD | Admitting: Family Medicine

## 2022-06-29 VITALS — BP 122/79 | HR 80 | Ht 65.0 in | Wt 209.0 lb

## 2022-06-29 DIAGNOSIS — L83 Acanthosis nigricans: Secondary | ICD-10-CM

## 2022-06-29 DIAGNOSIS — J019 Acute sinusitis, unspecified: Secondary | ICD-10-CM

## 2022-06-29 MED ORDER — AZITHROMYCIN 250 MG PO TABS: ORAL_TABLET | ORAL | 0 refills | Status: AC

## 2022-06-29 NOTE — Progress Notes (Signed)
Date:  06/29/2022   Name:  Vicki Hill   DOB:  February 10, 1995   MRN:  [id number]   Chief Complaint: Cough (Nasal production- green, deep cough- started Sunday/ stuffy congested) and Rash (L) side of neck- dry and itchy)  Rash This is a chronic problem. The current episode started more than 1 year ago. The problem has been gradually worsening since onset. The affected locations include the neck and back. Rash characteristics: discoloration. Associated with: gold chain. Associated symptoms include congestion, coughing, eye pain, fatigue and rhinorrhea. Pertinent negatives include no fever, shortness of breath or sore throat.  Sinusitis This is a new problem. The current episode started in the past 7 days. Associated symptoms include chills, congestion, coughing, diaphoresis, sinus pressure and sneezing. Pertinent negatives include no ear pain, shortness of breath or sore throat. Treatments tried: nyquil. The treatment provided mild relief.    Lab Results  Component Value Date   NA 137 03/20/2017   K 3.5 03/20/2017   CO2 24 03/20/2017   GLUCOSE 89 03/20/2017   BUN 10 03/20/2017   CREATININE 0.64 03/20/2017   CALCIUM 9.4 03/20/2017   GFRNONAA >60 03/20/2017   No results found for: "CHOL", "HDL", "LDLCALC", "LDLDIRECT", "TRIG", "CHOLHDL" No results found for: "TSH" No results found for: "HGBA1C" Lab Results  Component Value Date   WBC 7.9 03/20/2017   HGB 13.3 03/20/2017   HCT 40.3 03/20/2017   MCV 87.3 03/20/2017   PLT 391 03/20/2017   Lab Results  Component Value Date   ALT 16 03/20/2017   AST 24 03/20/2017   ALKPHOS 44 03/20/2017   BILITOT 0.6 03/20/2017   No results found for: "25OHVITD2", "25OHVITD3", "VD25OH"   Review of Systems  Constitutional:  Positive for chills, diaphoresis and fatigue. Negative for fever.  HENT:  Positive for congestion, postnasal drip, rhinorrhea, sinus pressure and sneezing. Negative for ear discharge, ear pain, nosebleeds, sinus pain and  sore throat.   Eyes:  Positive for pain. Negative for discharge.  Respiratory:  Positive for cough. Negative for shortness of breath and wheezing.   Skin:  Positive for rash.    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   Breast mass, left 08/18/2016    Allergies  Allergen Reactions   Amoxicillin Anaphylaxis   Penicillins Anaphylaxis    Past Surgical History:  Procedure Laterality Date   BREAST BIOPSY Left 01/25/2022   [place] LT BREAST BX W LOC DEV 1ST LESION IMG BX SPEC US GUIDE 01/25/2022 [facility]   EXCISION OF BREAST BIOPSY Left 02/24/2022   Procedure: EXCISION OF BREAST BIOPSY;  Surgeon: Campbell Lerner, MD;  Location: [facility];  Service: General;  Laterality: Left;    Social History   Tobacco Use   Smoking status: Never    Passive exposure: Never   Smokeless tobacco: Never  Vaping Use   Vaping Use: Never used  Substance Use Topics   Alcohol use: Yes    Comment: rarely   Drug use: Never     Medication list has been reviewed and updated.  Current Meds  Medication Sig   ibuprofen (ADVIL) 200 MG tablet Take 400 mg by mouth every 6 (six) hours as needed.   [DISCONTINUED] fluconazole (DIFLUCAN) 150 MG tablet Take 1 tablet (150 mg total) by mouth daily.       06/29/2022    2:25 PM 12/30/2021   11:23  AM 12/13/2021    2:39 PM  GAD 7 : Generalized Anxiety Score  Nervous, Anxious, on Edge 0 0 0  Control/stop worrying 0 0 0  Worry too much - different things 0 0 0  Trouble relaxing 0 0 0  Restless 0 0 0  Easily annoyed or irritable 0 0 0  Afraid - awful might happen 0 0 0  Total GAD 7 Score 0 0 0  Anxiety Difficulty Not difficult at all Not difficult at all Not difficult at all       06/29/2022    2:25 PM 12/30/2021   11:23 AM 12/13/2021    2:39 PM  Depression screen PHQ 2/9  Decreased Interest 0 0 0  Down, Depressed, Hopeless 0  0 0  PHQ - 2 Score 0 0 0  Altered sleeping 0 0 0  Tired, decreased energy 0 0 0  Change in appetite 0 0 0  Feeling bad or failure about yourself  0 0 0  Trouble concentrating 0 0 0  Moving slowly or fidgety/restless 0 0 0  Suicidal thoughts 0 0 0  PHQ-9 Score 0 0 0  Difficult doing work/chores Not difficult at all Not difficult at all Not difficult at all    BP Readings from Last 3 Encounters:  06/29/22 122/79  03/09/22 114/78  02/24/22 123/78    Physical Exam Vitals reviewed.  HENT:     Head: Normocephalic.     Right Ear: Tympanic membrane, ear canal and external ear normal.     Left Ear: Tympanic membrane, ear canal and external ear normal.     Nose: Nose normal.     Mouth/Throat:     Mouth: Mucous membranes are moist.  Eyes:     General: No scleral icterus.    Pupils: Pupils are equal, round, and reactive to light.  Cardiovascular:     Rate and Rhythm: Normal rate and regular rhythm.     Pulses: Normal pulses.     Heart sounds: Normal heart sounds. No murmur heard.    No friction rub. No gallop.  Pulmonary:     Breath sounds: No wheezing, rhonchi or rales.  Abdominal:     General: Abdomen is flat. There is no distension.     Tenderness: There is no abdominal tenderness.  Musculoskeletal:     Cervical back: Normal range of motion.  Neurological:     Mental Status: She is alert.     Wt Readings from Last 3 Encounters:  06/29/22 209 lb (94.8 kg)  03/09/22 206 lb (93.4 kg)  02/24/22 205 lb (93 kg)    BP 122/79   Pulse 80   Ht 5\' 5"  (1.651 m)   Wt 209 lb (94.8 kg)   LMP 06/28/2022 (Exact Date)   BMI 34.78 kg/m   Assessment and Plan: 1. Acute non-recurrent sinusitis, unspecified location Chronic.  Controlled.  Stable.  Exam and history is consistent with sinus infection we will treat with azithromycin to 50 mg 2 today followed by 1 a day for 4 days. - azithromycin (ZITHROMAX) 250 MG tablet; Take 2 tablets on day 1, then 1 tablet daily on days 2 through 5   Dispense: 6 tablet; Refill: 0  2. Acanthosis nigricans Patient has a darkening rash across the posterior aspect of the neck where her necklace that may or may not be of significant cold content comes in contact.  There is also noted to be darkening in the axillary areas as well and has some irritation  around a ring.  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 - Ambulatory referral to Dermatology     Elizabeth Sauer, MD

## 2022-06-29 NOTE — Patient Instructions (Signed)
Acanthosis Nigricans  Acanthosis nigricans is a condition in which dark, velvety markings appear on the skin. What are the causes? This condition may be caused by: A disorder that affects your hormones or glands. This includes diabetes. Obesity. Certain medicines, such as birth control pills. A tumor. This is rare. This condition may run in families. What increases the risk? You are more likely to develop this condition if: You have a disorder that affects your hormones or glands. You are overweight. You take certain medicines. You have certain cancers, such as stomach cancer. You have dark-colored skin (dark complexion). What are the signs or symptoms? The main symptom of this condition is velvety markings on the skin that are light brown, black, or gray in color. The markings often appear on the face. They may also show up in skinfolds, such as on the neck, armpits, inner thighs, and groin. In severe cases, markings may also appear on the lips, hands, breasts, eyelids, and mouth. How is this diagnosed? This condition may be diagnosed based on: A physical exam. Your health care provider will look at your skin. A skin sample may be taken for testing (skin biopsy). You may also have tests to find the cause of the condition. How is this treated? Treatment depends on the cause. It may involve lowering insulin levels. These levels are often high in people who have this condition. Insulin levels can be reduced by: Making changes to your diet. You may need to avoid starchy foods and sugars. Losing weight. Taking medicines. Treatment may also include: Medicines to help your skin look better. Laser treatment to help your skin look better. Surgery to remove the skin markings (dermabrasion). Follow these instructions at home: Take over-the-counter and prescription medicines only as told by your health care provider. Follow instructions from your health care provider about what you may eat  and drink. If you are overweight, work with your health care provider and a dietitian to set a weight-loss goal that is healthy and reasonable for you. Keep all follow-up visits. Work with your health care provider to manage the cause of your condition. Contact a health care provider if: New markings form on a part of the body where they do not often develop. This includes your lips, hands, breasts, eyelids, or mouth. The condition comes back, and you are not sure why. This information is not intended to replace advice given to you by your health care provider. Make sure you discuss any questions you have with your health care provider. Document Revised: 06/07/2021 Document Reviewed: 06/07/2021 Elsevier Patient Education  2024 [employer].

## 2022-07-17 ENCOUNTER — Encounter: Payer: Self-pay | Admitting: Family Medicine

## 2022-07-17 ENCOUNTER — Encounter: Payer: BLUE CROSS/BLUE SHIELD | Admitting: Family Medicine

## 2022-07-27 ENCOUNTER — Encounter: Payer: BLUE CROSS/BLUE SHIELD | Admitting: Family Medicine

## 2022-07-31 ENCOUNTER — Encounter: Payer: Self-pay | Admitting: Family Medicine

## 2022-09-08 ENCOUNTER — Encounter: Payer: Self-pay | Admitting: Family Medicine

## 2022-09-08 ENCOUNTER — Ambulatory Visit (INDEPENDENT_AMBULATORY_CARE_PROVIDER_SITE_OTHER): Payer: BLUE CROSS/BLUE SHIELD | Admitting: Family Medicine

## 2022-09-08 VITALS — BP 118/72 | HR 96 | Ht 65.0 in | Wt 215.0 lb

## 2022-09-08 DIAGNOSIS — Z0000 Encounter for general adult medical examination without abnormal findings: Secondary | ICD-10-CM | POA: Diagnosis not present

## 2022-09-08 NOTE — Patient Instructions (Signed)
Calorie Counting for Weight Loss Calories are units of energy. Your body needs a certain number of calories from food to keep going throughout the day. When you eat or drink more calories than your body needs, your body stores the extra calories mostly as fat. When you eat or drink fewer calories than your body needs, your body burns fat to get the energy it needs. Calorie counting means keeping track of how many calories you eat and drink each day. Calorie counting can be helpful if you need to lose weight. If you eat fewer calories than your body needs, you should lose weight. Ask your health care provider what a healthy weight is for you. For calorie counting to work, you will need to eat the right number of calories each day to lose a healthy amount of weight per week. A dietitian can help you figure out how many calories you need in a day and will suggest ways to reach your calorie goal. A healthy amount of weight to lose each week is usually 1-2 lb (0.5-0.9 kg). This usually means that your daily calorie intake should be reduced by 500-750 calories. Eating 1,200-1,500 calories a day can help most women lose weight. Eating 1,500-1,800 calories a day can help most men lose weight. What do I need to know about calorie counting? Work with your health care provider or dietitian to determine how many calories you should get each day. To meet your daily calorie goal, you will need to: Find out how many calories are in each food that you would like to eat. Try to do this before you eat. Decide how much of the food you plan to eat. Keep a food log. Do this by writing down what you ate and how many calories it had. To successfully lose weight, it is important to balance calorie counting with a healthy lifestyle that includes regular activity. Where do I find calorie information?  The number of calories in a food can be found on a Nutrition Facts label. If a food does not have a Nutrition Facts label, try  to look up the calories online or ask your dietitian for help. Remember that calories are listed per serving. If you choose to have more than one serving of a food, you will have to multiply the calories per serving by the number of servings you plan to eat. For example, the label on a package of bread might say that a serving size is 1 slice and that there are 90 calories in a serving. If you eat 1 slice, you will have eaten 90 calories. If you eat 2 slices, you will have eaten 180 calories. How do I keep a food log? After each time that you eat, record the following in your food log as soon as possible: What you ate. Be sure to include toppings, sauces, and other extras on the food. How much you ate. This can be measured in cups, ounces, or number of items. How many calories were in each food and drink. The total number of calories in the food you ate. Keep your food log near you, such as in a pocket-sized notebook or on an app or website on your mobile phone. Some programs will calculate calories for you and show you how many calories you have left to meet your daily goal. What are some portion-control tips? Know how many calories are in a serving. This will help you know how many servings you can have of a certain   food. Use a measuring cup to measure serving sizes. You could also try weighing out portions on a kitchen scale. With time, you will be able to estimate serving sizes for some foods. Take time to put servings of different foods on your favorite plates or in your favorite bowls and cups so you know what a serving looks like. Try not to eat straight from a food's packaging, such as from a bag or box. Eating straight from the package makes it hard to see how much you are eating and can lead to overeating. Put the amount you would like to eat in a cup or on a plate to make sure you are eating the right portion. Use smaller plates, glasses, and bowls for smaller portions and to prevent  overeating. Try not to multitask. For example, avoid watching TV or using your computer while eating. If it is time to eat, sit down at a table and enjoy your food. This will help you recognize when you are full. It will also help you be more mindful of what and how much you are eating. What are tips for following this plan? Reading food labels Check the calorie count compared with the serving size. The serving size may be smaller than what you are used to eating. Check the source of the calories. Try to choose foods that are high in protein, fiber, and vitamins, and low in saturated fat, trans fat, and sodium. Shopping Read nutrition labels while you shop. This will help you make healthy decisions about which foods to buy. Pay attention to nutrition labels for low-fat or fat-free foods. These foods sometimes have the same number of calories or more calories than the full-fat versions. They also often have added sugar, starch, or salt to make up for flavor that was removed with the fat. Make a grocery list of lower-calorie foods and stick to it. Cooking Try to cook your favorite foods in a healthier way. For example, try baking instead of frying. Use low-fat dairy products. Meal planning Use more fruits and vegetables. One-half of your plate should be fruits and vegetables. Include lean proteins, such as chicken, turkey, and fish. Lifestyle Each week, aim to do one of the following: 150 minutes of moderate exercise, such as walking. 75 minutes of vigorous exercise, such as running. General information Know how many calories are in the foods you eat most often. This will help you calculate calorie counts faster. Find a way of tracking calories that works for you. Get creative. Try different apps or programs if writing down calories does not work for you. What foods should I eat?  Eat nutritious foods. It is better to have a nutritious, high-calorie food, such as an avocado, than a food with  few nutrients, such as a bag of potato chips. Use your calories on foods and drinks that will fill you up and will not leave you hungry soon after eating. Examples of foods that fill you up are nuts and nut butters, vegetables, lean proteins, and high-fiber foods such as whole grains. High-fiber foods are foods with more than 5 g of fiber per serving. Pay attention to calories in drinks. Low-calorie drinks include water and unsweetened drinks. The items listed above may not be a complete list of foods and beverages you can eat. Contact a dietitian for more information. What foods should I limit? Limit foods or drinks that are not good sources of vitamins, minerals, or protein or that are high in unhealthy fats. These   include: Candy. Other sweets. Sodas, specialty coffee drinks, alcohol, and juice. The items listed above may not be a complete list of foods and beverages you should avoid. Contact a dietitian for more information. How do I count calories when eating out? Pay attention to portions. Often, portions are much larger when eating out. Try these tips to keep portions smaller: Consider sharing a meal instead of getting your own. If you get your own meal, eat only half of it. Before you start eating, ask for a container and put half of your meal into it. When available, consider ordering smaller portions from the menu instead of full portions. Pay attention to your food and drink choices. Knowing the way food is cooked and what is included with the meal can help you eat fewer calories. If calories are listed on the menu, choose the lower-calorie options. Choose dishes that include vegetables, fruits, whole grains, low-fat dairy products, and lean proteins. Choose items that are boiled, broiled, grilled, or steamed. Avoid items that are buttered, battered, fried, or served with cream sauce. Items labeled as crispy are usually fried, unless stated otherwise. Choose water, low-fat milk,  unsweetened iced tea, or other drinks without added sugar. If you want an alcoholic beverage, choose a lower-calorie option, such as a glass of wine or light beer. Ask for dressings, sauces, and syrups on the side. These are usually high in calories, so you should limit the amount you eat. If you want a salad, choose a garden salad and ask for grilled meats. Avoid extra toppings such as bacon, cheese, or fried items. Ask for the dressing on the side, or ask for olive oil and vinegar or lemon to use as dressing. Estimate how many servings of a food you are given. Knowing serving sizes will help you be aware of how much food you are eating at restaurants. Where to find more information Centers for Disease Control and Prevention: www.cdc.gov [place] Department of Agriculture: myplate.gov Summary Calorie counting means keeping track of how many calories you eat and drink each day. If you eat fewer calories than your body needs, you should lose weight. A healthy amount of weight to lose per week is usually 1-2 lb (0.5-0.9 kg). This usually means reducing your daily calorie intake by 500-750 calories. The number of calories in a food can be found on a Nutrition Facts label. If a food does not have a Nutrition Facts label, try to look up the calories online or ask your dietitian for help. Use smaller plates, glasses, and bowls for smaller portions and to prevent overeating. Use your calories on foods and drinks that will fill you up and not leave you hungry shortly after a meal. This information is not intended to replace advice given to you by your health care provider. Make sure you discuss any questions you have with your health care provider. Document Revised: 02/06/2019 Document Reviewed: 02/06/2019 Elsevier Patient Education  2023 [employer]  

## 2022-09-08 NOTE — Progress Notes (Signed)
Date:  09/08/2022   Name:  Vicki Hill   DOB:  1995-05-25   MRN:  [id number]   Chief Complaint: Annual Exam  Patient is a 27 year old female who presents for a comprehensive physical exam. The patient reports the following problems: none. Health maintenance has been reviewed up to date.     Lab Results  Component Value Date   NA 137 03/20/2017   K 3.5 03/20/2017   CO2 24 03/20/2017   GLUCOSE 89 03/20/2017   BUN 10 03/20/2017   CREATININE 0.64 03/20/2017   CALCIUM 9.4 03/20/2017   GFRNONAA >60 03/20/2017   No results found for: "CHOL", "HDL", "LDLCALC", "LDLDIRECT", "TRIG", "CHOLHDL" No results found for: "TSH" No results found for: "HGBA1C" Lab Results  Component Value Date   WBC 7.9 03/20/2017   HGB 13.3 03/20/2017   HCT 40.3 03/20/2017   MCV 87.3 03/20/2017   PLT 391 03/20/2017   Lab Results  Component Value Date   ALT 16 03/20/2017   AST 24 03/20/2017   ALKPHOS 44 03/20/2017   BILITOT 0.6 03/20/2017   No results found for: "25OHVITD2", "25OHVITD3", "VD25OH"   Review of Systems  Constitutional: Negative.  Negative for chills, fatigue, fever and unexpected weight change.  HENT:  Negative for congestion, ear discharge, ear pain, rhinorrhea, sinus pressure, sneezing and sore throat.   Eyes:  Negative for visual disturbance.  Respiratory:  Negative for cough, shortness of breath, wheezing and stridor.   Cardiovascular:  Negative for chest pain and palpitations.  Gastrointestinal:  Negative for abdominal pain, blood in stool, constipation, diarrhea and nausea.  Endocrine: Negative for polyuria.  Genitourinary:  Negative for difficulty urinating, dysuria, flank pain, frequency, hematuria, urgency, vaginal bleeding and vaginal discharge.  Musculoskeletal:  Negative for arthralgias, back pain and myalgias.  Skin:  Negative for rash.  Neurological:  Negative for dizziness, weakness and headaches.  Hematological:  Negative for adenopathy. Does not bruise/bleed  easily.  Psychiatric/Behavioral:  Negative for dysphoric mood. The patient is not nervous/anxious.     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   Breast mass, left 08/18/2016    Allergies  Allergen Reactions   Amoxicillin Anaphylaxis   Penicillins Anaphylaxis    Past Surgical History:  Procedure Laterality Date   BREAST BIOPSY Left 01/25/2022   [place] LT BREAST BX W LOC DEV 1ST LESION IMG BX SPEC US GUIDE 01/25/2022 [facility]   EXCISION OF BREAST BIOPSY Left 02/24/2022   Procedure: EXCISION OF BREAST BIOPSY;  Surgeon: Campbell Lerner, MD;  Location: [facility];  Service: General;  Laterality: Left;    Social History   Tobacco Use   Smoking status: Never    Passive exposure: Never   Smokeless tobacco: Never  Vaping Use   Vaping status: Never Used  Substance Use Topics   Alcohol use: Yes    Comment: rarely   Drug use: Never     Medication list has been reviewed and updated.  No outpatient medications have been marked as taking for the 09/08/22 encounter (Office Visit) with Duanne Limerick, MD.       09/08/2022   10:24 AM 06/29/2022    2:25 PM 12/30/2021   11:23 AM 12/13/2021    2:39 PM  GAD 7 : Generalized Anxiety Score  Nervous, Anxious, on Edge 0 0 0 0  Control/stop worrying 0 0 0 0  Worry too much - different things 0 0 0 0  Trouble relaxing 0 0 0 0  Restless 0 0 0 0  Easily annoyed or irritable 0 0 0 0  Afraid - awful might happen 0 0 0 0  Total GAD 7 Score 0 0 0 0  Anxiety Difficulty Not difficult at all Not difficult at all Not difficult at all Not difficult at all       09/08/2022   10:23 AM 06/29/2022    2:25 PM 12/30/2021   11:23 AM  Depression screen PHQ 2/9  Decreased Interest 0 0 0  Down, Depressed, Hopeless 0 0 0  PHQ - 2 Score 0 0 0  Altered sleeping 0 0 0  Tired, decreased energy 0 0 0  Change in appetite 0 0 0   Feeling bad or failure about yourself  0 0 0  Trouble concentrating 0 0 0  Moving slowly or fidgety/restless 0 0 0  Suicidal thoughts 0 0 0  PHQ-9 Score 0 0 0  Difficult doing work/chores Not difficult at all Not difficult at all Not difficult at all    BP Readings from Last 3 Encounters:  09/08/22 118/72  06/29/22 122/79  03/09/22 114/78    Physical Exam Vitals and nursing note reviewed.  Constitutional:      Appearance: She is well-developed and well-groomed. She is obese.  HENT:     Head: Normocephalic.     Jaw: There is normal jaw occlusion. No tenderness or swelling.     Salivary Glands: Right salivary gland is not diffusely enlarged or tender. Left salivary gland is not diffusely enlarged or tender.     Right Ear: Hearing, tympanic membrane, ear canal and external ear normal.     Left Ear: Hearing, tympanic membrane, ear canal and external ear normal.     Nose: Nose normal.     Right Turbinates: Not enlarged.     Left Turbinates: Not enlarged.     Mouth/Throat:     Lips: Pink.     Mouth: Mucous membranes are moist.     Tongue: No lesions.     Palate: No mass.     Pharynx: Oropharynx is clear. Uvula midline.     Tonsils: No tonsillar exudate or tonsillar abscesses.  Eyes:     General: Lids are normal. Vision grossly intact. No scleral icterus.       Left eye: No foreign body or hordeolum.     Conjunctiva/sclera: Conjunctivae normal.     Right eye: Right conjunctiva is not injected.     Left eye: Left conjunctiva is not injected.     Pupils: Pupils are equal, round, and reactive to light.     Funduscopic exam:    Right eye: Red reflex present.        Left eye: Red reflex present. Neck:     Thyroid: No thyroid mass, thyromegaly or thyroid tenderness.     Vascular: Normal carotid pulses. No carotid bruit, hepatojugular reflux or JVD.     Trachea: Trachea and phonation normal. No tracheal deviation.  Cardiovascular:     Rate and Rhythm: Normal rate and regular  rhythm.     Chest Wall: PMI is not displaced.     Pulses:          Carotid pulses are 2+ on the right side and 2+ on the left side.      Radial pulses are 2+ on the right side and 2+ on the left side.  Dorsalis pedis pulses are 2+ on the right side and 2+ on the left side.       Posterior tibial pulses are 2+ on the right side and 2+ on the left side.     Heart sounds: Normal heart sounds, S1 normal and S2 normal. No murmur heard.    No systolic murmur is present.     No diastolic murmur is present.     No friction rub. No gallop. No S3 or S4 sounds.  Pulmonary:     Effort: Pulmonary effort is normal. No respiratory distress.     Breath sounds: Normal breath sounds. No decreased breath sounds, wheezing, rhonchi or rales.  Abdominal:     General: Bowel sounds are normal.     Palpations: Abdomen is soft. There is no hepatomegaly, splenomegaly or mass.     Tenderness: There is no abdominal tenderness. There is no guarding or rebound.  Musculoskeletal:        General: No tenderness. Normal range of motion.     Cervical back: Full passive range of motion without pain, normal range of motion and neck supple.     Right lower leg: No edema.     Left lower leg: No edema.  Lymphadenopathy:     Head:     Right side of head: No submental adenopathy.     Left side of head: No submental adenopathy.     Cervical: No cervical adenopathy.     Right cervical: No superficial, deep or posterior cervical adenopathy.    Left cervical: No superficial, deep or posterior cervical adenopathy.     Upper Body:     Right upper body: No supraclavicular or axillary adenopathy.     Left upper body: No supraclavicular or axillary adenopathy.  Skin:    General: Skin is warm.     Capillary Refill: Capillary refill takes less than 2 seconds.     Findings: No rash.  Neurological:     Mental Status: She is alert and oriented to person, place, and time.     Cranial Nerves: Cranial nerves 2-12 are intact. No  cranial nerve deficit.     Sensory: Sensation is intact.     Motor: Motor function is intact.     Deep Tendon Reflexes: Reflexes normal.     Reflex Scores:      Tricep reflexes are 2+ on the right side and 2+ on the left side.      Bicep reflexes are 2+ on the right side and 2+ on the left side.      Brachioradialis reflexes are 2+ on the right side and 2+ on the left side.      Patellar reflexes are 2+ on the right side and 2+ on the left side. Psychiatric:        Mood and Affect: Mood is not anxious or depressed.        Behavior: Behavior is cooperative.    Wt Readings from Last 3 Encounters:  09/08/22 215 lb (97.5 kg)  06/29/22 209 lb (94.8 kg)  03/09/22 206 lb (93.4 kg)    BP 118/72   Pulse 96   Ht 5\' 5"  (1.651 m)   Wt 215 lb (97.5 kg)   LMP 08/23/2022 (Approximate)   SpO2 98%   BMI 35.78 kg/m   Assessment and Plan:  KAROLINA OCEAN is a 27 y.o. female who presents today for her Complete Annual Exam. She feels well. She reports exercising . She reports she is sleeping  fairly well.  Immunizations are reviewed and recommendations provided.   Age appropriate screening tests are discussed. Counseling given for risk factor reduction interventions.  1. Annual physical exam No subjective/objective concerns noted during HPI, review of systems, or physical exam.  Will obtain lipid panel, CMP, HIV and hep C antibody evaluation.  Will recheck on as needed concerns. - Lipid Panel With LDL/HDL Ratio - Comprehensive Metabolic Panel (CMET) - HIV Antibody (routine testing w rflx) - Hepatitis C antibody   Elizabeth Sauer, MD

## 2023-01-07 ENCOUNTER — Telehealth: Payer: Self-pay | Admitting: Family

## 2023-01-07 DIAGNOSIS — N760 Acute vaginitis: Secondary | ICD-10-CM

## 2023-01-07 DIAGNOSIS — B9689 Other specified bacterial agents as the cause of diseases classified elsewhere: Secondary | ICD-10-CM

## 2023-01-07 MED ORDER — CLINDAMYCIN HCL 300 MG PO CAPS: 300.0000 mg | ORAL_CAPSULE | Freq: Two times a day (BID) | ORAL | 0 refills | Status: DC

## 2023-01-07 MED ORDER — FLUCONAZOLE 150 MG PO TABS: 150.0000 mg | ORAL_TABLET | ORAL | 0 refills | Status: DC | PRN

## 2023-01-07 NOTE — Progress Notes (Signed)
Virtual Visit Consent   Vicki Hill, you are scheduled for a virtual visit with a [facility] provider today. Just as with appointments in the office, your consent must be obtained to participate. Your consent will be active for this visit and any virtual visit you may have with one of our providers in the next 365 days. If you have a MyChart account, a copy of this consent can be sent to you electronically.  As this is a virtual visit, video technology does not allow for your provider to perform a traditional examination. This may limit your provider's ability to fully assess your condition. If your provider identifies any concerns that need to be evaluated in person or the need to arrange testing (such as labs, EKG, etc.), we will make arrangements to do so. Although advances in technology are sophisticated, we cannot ensure that it will always work on either your end or our end. If the connection with a video visit is poor, the visit may have to be switched to a telephone visit. With either a video or telephone visit, we are not always able to ensure that we have a secure connection.  By engaging in this virtual visit, you consent to the provision of healthcare and authorize for your insurance to be billed (if applicable) for the services provided during this visit. Depending on your insurance coverage, you may receive a charge related to this service.  I need to obtain your verbal consent now. Are you willing to proceed with your visit today? Vicki Hill has provided verbal consent on 01/07/2023 for a virtual visit (video or telephone). Jannifer Rodney, FNP  Date: 01/07/2023 6:25 PM  Virtual Visit via Video Note   I, Jannifer Rodney, connected with  Vicki Hill  ([id number], October 20, 1995) on 01/07/23 at  6:30 PM EST by a video-enabled telemedicine application and verified that I am speaking with the correct person using two identifiers.  Location: Patient: Virtual Visit Location  Patient: Home Provider: Virtual Visit Location Provider: Home Office   I discussed the limitations of evaluation and management by telemedicine and the availability of in person appointments. The patient expressed understanding and agreed to proceed.    History of Present Illness: Vicki Hill is a 27 y.o. who identifies as a female who was assigned female at birth, and is being seen today for vaginal discharge .  HPI: Vaginal Discharge The patient's primary symptoms include a genital odor and vaginal discharge. The patient's pertinent negatives include no genital itching. This is a recurrent problem. The current episode started in the past 7 days. The problem occurs constantly. Pertinent negatives include no constipation, dysuria, fever or frequency. The vaginal discharge was milky and white.    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   Breast mass, left 08/18/2016    Allergies:  Allergies  Allergen Reactions   Amoxicillin Anaphylaxis   Penicillins Anaphylaxis   Medications:  Current Outpatient Medications:    clindamycin (CLEOCIN) 300 MG capsule, Take 1 capsule (300 mg total) by mouth in the morning and at bedtime., Disp: 14 capsule, Rfl: 0   fluconazole (DIFLUCAN) 150 MG tablet, Take 1 tablet (150 mg total) by mouth every three (3) days as needed., Disp: 2 tablet, Rfl: 0   ibuprofen (ADVIL) 200 MG tablet, Take 400 mg by mouth every 6 (six) hours as needed., Disp: ,  Rfl:   Observations/Objective: Patient is well-developed, well-nourished in no acute distress.  Resting comfortably  at home.  Head is normocephalic, atraumatic.  No labored breathing.  Speech is clear and coherent with logical content.  Patient is alert and oriented at baseline.    Assessment and Plan: 1. BV (bacterial vaginosis) (Primary)  Pt states she can not  tolerate Flagyl PO or gel Probiotic  Avoid douche Follow up if symptoms worsen or not improve   Follow Up Instructions: I discussed the assessment and treatment plan with the patient. The patient was provided an opportunity to ask questions and all were answered. The patient agreed with the plan and demonstrated an understanding of the instructions.  A copy of instructions were sent to the patient via MyChart unless otherwise noted below.     The patient was advised to call back or seek an in-person evaluation if the symptoms worsen or if the condition fails to improve as anticipated.    Jannifer Rodney, FNP

## 2023-05-07 ENCOUNTER — Encounter: Payer: Self-pay | Admitting: Family Medicine

## 2023-05-08 ENCOUNTER — Ambulatory Visit: Admitting: Family Medicine

## 2023-05-08 ENCOUNTER — Encounter: Payer: Self-pay | Admitting: Family Medicine

## 2023-05-08 VITALS — BP 113/79 | HR 92 | Temp 98.4°F | Resp 16 | Ht 65.0 in | Wt 213.5 lb

## 2023-05-08 DIAGNOSIS — J329 Chronic sinusitis, unspecified: Secondary | ICD-10-CM

## 2023-05-08 DIAGNOSIS — H1012 Acute atopic conjunctivitis, left eye: Secondary | ICD-10-CM

## 2023-05-08 DIAGNOSIS — Z6835 Body mass index (BMI) 35.0-35.9, adult: Secondary | ICD-10-CM

## 2023-05-08 MED ORDER — OLOPATADINE HCL 0.1 % OP SOLN: 1.0000 [drp] | Freq: Two times a day (BID) | OPHTHALMIC | 12 refills | Status: DC

## 2023-05-08 NOTE — Progress Notes (Signed)
 Date:  05/08/2023   Name:  Vicki Hill   DOB:  08/25/1995   MRN:  [id number]   Chief Complaint: Nasal Congestion (Has had multiple visits for congestion and wants referral to ENT ) and Eye Problem (Blood shot L eye since Sunday.  Blurred vision. )  Eye Problem  The left eye is affected. This is a new problem. The current episode started in the past 7 days. The problem occurs intermittently. There was no injury mechanism. The patient is experiencing no pain. Associated symptoms include eye redness. Pertinent negatives include no blurred vision, eye discharge, double vision, fever, foreign body sensation, itching, nausea, photophobia, recent URI or vomiting. She has tried eye drops (tears) for the symptoms. The treatment provided mild relief.    Lab Results  Component Value Date   NA 137 03/20/2017   K 3.5 03/20/2017   CO2 24 03/20/2017   GLUCOSE 89 03/20/2017   BUN 10 03/20/2017   CREATININE 0.64 03/20/2017   CALCIUM 9.4 03/20/2017   GFRNONAA >60 03/20/2017   No results found for: "CHOL", "HDL", "LDLCALC", "LDLDIRECT", "TRIG", "CHOLHDL" No results found for: "TSH" No results found for: "HGBA1C" Lab Results  Component Value Date   WBC 7.9 03/20/2017   HGB 13.3 03/20/2017   HCT 40.3 03/20/2017   MCV 87.3 03/20/2017   PLT 391 03/20/2017   Lab Results  Component Value Date   ALT 16 03/20/2017   AST 24 03/20/2017   ALKPHOS 44 03/20/2017   BILITOT 0.6 03/20/2017   No results found for: "25OHVITD2", "25OHVITD3", "VD25OH"   Review of Systems  Constitutional:  Negative for fever.  HENT:  Negative for ear pain, hearing loss and nosebleeds.   Eyes:  Positive for redness. Negative for blurred vision, double vision, photophobia, discharge and itching.  Respiratory:  Negative for chest tightness, shortness of breath and wheezing.   Cardiovascular:  Negative for chest pain.  Gastrointestinal:  Negative for nausea and vomiting.    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   Breast mass, left 08/18/2016    Allergies  Allergen Reactions   Amoxicillin  Anaphylaxis   Penicillins Anaphylaxis    Past Surgical History:  Procedure Laterality Date   BREAST BIOPSY Left 01/25/2022   US  LT BREAST BX W LOC DEV 1ST LESION IMG BX SPEC US  GUIDE 01/25/2022 [facility]   EXCISION OF BREAST BIOPSY Left 02/24/2022   Procedure: EXCISION OF BREAST BIOPSY;  Surgeon: Flynn Hylan, MD;  Location: [facility];  Service: General;  Laterality: Left;    Social History   Tobacco Use   Smoking status: Never    Passive exposure: Never   Smokeless tobacco: Never  Vaping Use   Vaping status: Never Used  Substance Use Topics   Alcohol use: Yes    Comment: rarely   Drug use: Never     Medication list has been reviewed and updated.  Current Meds  Medication Sig   Carboxymethylcellulose Sodium (EYE DROPS OP) Apply to eye.   ibuprofen  (ADVIL ) 200 MG tablet Take 400 mg by mouth every 6 (six) hours as needed.   Sodium Chloride-Sodium Bicarb (NETI POT SINUS WASH NA) Place into the nose.       09/08/2022   10:24 AM 06/29/2022    2:25 PM 12/30/2021   11:23 AM 12/13/2021    2:39 PM  GAD 7 : Generalized Anxiety Score  Nervous, Anxious, on Edge 0 0 0 0  Control/stop worrying 0 0 0 0  Worry too much - different things 0 0 0 0  Trouble relaxing 0 0 0 0  Restless 0 0 0 0  Easily annoyed or irritable 0 0 0 0  Afraid - awful might happen 0 0 0 0  Total GAD 7 Score 0 0 0 0  Anxiety Difficulty Not difficult at all Not difficult at all Not difficult at all Not difficult at all       09/08/2022   10:23 AM 06/29/2022    2:25 PM 12/30/2021   11:23 AM  Depression screen PHQ 2/9  Decreased Interest 0 0 0  Down, Depressed, Hopeless 0 0 0  PHQ - 2 Score 0 0 0  Altered sleeping 0 0 0  Tired, decreased energy 0 0 0  Change in appetite 0 0 0  Feeling  bad or failure about yourself  0 0 0  Trouble concentrating 0 0 0  Moving slowly or fidgety/restless 0 0 0  Suicidal thoughts 0 0 0  PHQ-9 Score 0 0 0  Difficult doing work/chores Not difficult at all Not difficult at all Not difficult at all    BP Readings from Last 3 Encounters:  05/08/23 113/79  09/08/22 118/72  06/29/22 122/79    Physical Exam Vitals and nursing note reviewed.  Constitutional:      General: She is not in acute distress.    Appearance: She is not diaphoretic.  HENT:     Head: Normocephalic and atraumatic.     Right Ear: External ear normal.     Left Ear: External ear normal.     Nose: Nose normal. No congestion or rhinorrhea.     Mouth/Throat:     Mouth: Mucous membranes are moist.  Eyes:     General:        Right eye: No discharge.        Left eye: No discharge.     Conjunctiva/sclera: Conjunctivae normal.     Pupils: Pupils are equal, round, and reactive to light.  Neck:     Thyroid: No thyromegaly.     Vascular: No JVD.  Cardiovascular:     Rate and Rhythm: Normal rate and regular rhythm.  Pulmonary:     Effort: Pulmonary effort is normal.     Breath sounds: Normal breath sounds.  Musculoskeletal:     Cervical back: Neck supple.  Skin:    General: Skin is warm.  Neurological:     Mental Status: She is alert.     Deep Tendon Reflexes: Reflexes are normal and symmetric.     Wt Readings from Last 3 Encounters:  05/08/23 213 lb 8 oz (96.8 kg)  09/08/22 215 lb (97.5 kg)  06/29/22 209 lb (94.8 kg)    BP 113/79   Pulse 92   Temp 98.4 F (36.9 C) (Oral)   Resp 16   Ht 5\' 5"  (1.651 m)   Wt 213 lb 8 oz (96.8 kg)   LMP 04/20/2023   SpO2 98%   BMI 35.53 kg/m   Assessment and Plan: 1. Allergic conjunctivitis of left eye (Primary) New onset.  Onset yesterday is better today patient is had some irritation of the left eye consistent with what looks like some injection of the sclera of blood conjunctiva are clear there is probably an  allergic scleral conjunctivitis secondary to pollen and we will initiate with Pataday but does not resolve we will refer  to ophthalmology.  Patient has been encouraged to see her optometrist at [facility] - olopatadine (PATADAY) 0.1 % ophthalmic solution; Place 1 drop into the left eye 2 (two) times daily.  Dispense: 5 mL; Refill: 12  2. Chronic congestion of paranasal sinus Chronic.  Episodic) persistent over the course of several years patient has been using Nettie pot perhaps a little too excessively and we discussed effect on normal mucus.  We will refer to ear nose and throat for evaluation and possible allergy testing versus evaluation for chronic sinus concerns such as deviated septum. - Ambulatory referral to ENT  3. BMI 35.0-35.9,adult Patient has concerned about her weight and would like to attempt weight loss and we will refer to a weight management group for evaluation in the meantime we will obtain a TSH with T4. - Amb Ref to Medical Weight Management - TSH + free T4     Alayne Allis, MD

## 2023-05-09 ENCOUNTER — Encounter: Payer: Self-pay | Admitting: Family Medicine

## 2023-05-09 LAB — TSH+FREE T4
Free T4: 1.14 ng/dL (ref 0.82–1.77)
TSH: 1.96 u[IU]/mL (ref 0.450–4.500)

## 2023-05-16 ENCOUNTER — Encounter: Payer: Self-pay | Admitting: Family Medicine

## 2023-05-28 ENCOUNTER — Encounter: Payer: Self-pay | Admitting: Student

## 2023-05-28 ENCOUNTER — Other Ambulatory Visit: Payer: Self-pay | Admitting: Student

## 2023-05-28 DIAGNOSIS — D3705 Neoplasm of uncertain behavior of pharynx: Secondary | ICD-10-CM

## 2023-05-28 NOTE — Telephone Encounter (Signed)
 Please review patient message.   JM

## 2023-05-29 ENCOUNTER — Encounter: Payer: Self-pay | Admitting: Student

## 2023-05-30 ENCOUNTER — Ambulatory Visit
Admission: RE | Admit: 2023-05-30 | Discharge: 2023-05-30 | Disposition: A | Discharge status: Home / Self Care | Source: Ambulatory Visit | Attending: Student | Admitting: Student

## 2023-05-30 ENCOUNTER — Other Ambulatory Visit

## 2023-05-30 DIAGNOSIS — D3705 Neoplasm of uncertain behavior of pharynx: Secondary | ICD-10-CM

## 2023-05-30 MED ORDER — GADOPICLENOL 0.5 MMOL/ML IV SOLN
9.0000 mL | Freq: Once | INTRAVENOUS | Status: AC | PRN
  Administered 2023-05-30: 9 mL via INTRAVENOUS

## 2023-07-09 ENCOUNTER — Encounter (INDEPENDENT_AMBULATORY_CARE_PROVIDER_SITE_OTHER): Payer: Self-pay

## 2023-07-09 ENCOUNTER — Institutional Professional Consult (permissible substitution) (INDEPENDENT_AMBULATORY_CARE_PROVIDER_SITE_OTHER): Payer: Self-pay | Admitting: Family Medicine

## 2023-07-16 ENCOUNTER — Encounter (INDEPENDENT_AMBULATORY_CARE_PROVIDER_SITE_OTHER): Payer: Self-pay

## 2023-09-11 ENCOUNTER — Encounter: Payer: Self-pay | Admitting: Student

## 2023-09-11 ENCOUNTER — Encounter: Payer: Self-pay | Admitting: Family Medicine

## 2023-09-13 ENCOUNTER — Ambulatory Visit
Admission: RE | Admit: 2023-09-13 | Discharge: 2023-09-13 | Disposition: A | Discharge status: Home / Self Care | Source: Ambulatory Visit | Attending: Emergency Medicine | Admitting: Emergency Medicine

## 2023-09-13 VITALS — BP 100/60 | HR 100 | Temp 98.9°F | Resp 18

## 2023-09-13 DIAGNOSIS — N898 Other specified noninflammatory disorders of vagina: Secondary | ICD-10-CM | POA: Insufficient documentation

## 2023-09-13 MED ORDER — FLUCONAZOLE 150 MG PO TABS: 150.0000 mg | ORAL_TABLET | Freq: Once | ORAL | 0 refills | Status: DC | PRN

## 2023-09-13 MED ORDER — METRONIDAZOLE 500 MG PO TABS: 500.0000 mg | ORAL_TABLET | Freq: Two times a day (BID) | ORAL | 0 refills | Status: DC

## 2023-09-13 NOTE — ED Provider Notes (Signed)
 CAY RALPH PELT    CSN: [id number] Arrival date & time: 09/13/23  1347      History   Chief Complaint Chief Complaint  Patient presents with   Vaginal Discharge    I have chronic BV so I'm sure that's what it is. I still want to get a full std testing. - Entered by patient    HPI Vicki Hill is a 28 y.o. female.   Patient wishes Leonce Bale thin milky discharge present for 4 days with fishy odor.  Endorses history of chronic vaginosis.  Has not attempted treatment.  Sexually active, no known exposure.  Denies abdominal, flank pain, itching or irritation, urinary symptoms.  Last menstrual period 09/05/2023.  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      Home Medications    Prior to Admission medications   Medication Sig Start Date End Date Taking? Authorizing Provider  fluconazole  (DIFLUCAN ) 150 MG tablet Take 1 tablet (150 mg total) by mouth once as needed for up to 1 dose. 09/13/23  Yes Windie Marasco R, NP  metroNIDAZOLE  (FLAGYL ) 500 MG tablet Take 1 tablet (500 mg total) by mouth 2 (two) times daily. 09/13/23  Yes Cason Luffman R, NP  Carboxymethylcellulose Sodium (EYE DROPS OP) Apply to eye.    [provider]  clindamycin  (CLEOCIN ) 300 MG capsule Take 1 capsule (300 mg total) by mouth in  the morning and at bedtime. Patient not taking: Reported on 05/08/2023 01/07/23   Lavell Lye A, FNP  ibuprofen  (ADVIL ) 200 MG tablet Take 400 mg by mouth every 6 (six) hours as needed.    [provider]  olopatadine  (PATADAY ) 0.1 % ophthalmic solution Place 1 drop into the left eye 2 (two) times daily. 05/08/23   Joshua Cathryne BROCKS, MD  Sodium Chloride-Sodium Bicarb (NETI POT SINUS WASH NA) Place into the nose.    [provider]    Family History Family History  Problem Relation Age of Onset   Cancer Paternal Grandfather     Social History Social History   Tobacco Use   Smoking status: Never    Passive exposure: Never   Smokeless tobacco: Never  Vaping Use   Vaping status: Never Used  Substance Use Topics   Alcohol use: Yes    Comment: rarely   Drug use: Never     Allergies   Amoxicillin  and Penicillins   Review of Systems Review of Systems  Genitourinary:  Positive for vaginal discharge.     Physical Exam Triage Vital Signs ED Triage Vitals  Encounter Vitals Group     BP 09/13/23 1419 100/60     Girls Systolic BP Percentile --      Girls Diastolic BP Percentile --  Boys Systolic BP Percentile --      Boys Diastolic BP Percentile --      Pulse Rate 09/13/23 1419 100     Resp 09/13/23 1419 18     Temp 09/13/23 1419 98.9 F (37.2 C)     Temp Source 09/13/23 1419 Oral     SpO2 09/13/23 1419 99 %     Weight --      Height --      Head Circumference --      Peak Flow --      Pain Score 09/13/23 1415 0     Pain Loc --      Pain Education --      Exclude from Growth Chart --    No data found.  Updated Vital Signs BP 100/60 (BP Location: Right Arm)   Pulse 100   Temp 98.9 F (37.2 C) (Oral)   Resp 18   LMP 09/05/2023 (Exact Date)   SpO2 99%   Visual Acuity Right Eye Distance:   Left Eye Distance:   Bilateral Distance:    Right Eye Near:   Left Eye Near:    Bilateral Near:     Physical Exam Constitutional:       Appearance: Normal appearance.  Eyes:     Extraocular Movements: Extraocular movements intact.  Pulmonary:     Effort: Pulmonary effort is normal.  Genitourinary:    Comments: deferred Neurological:     Mental Status: She is alert and oriented to person, place, and time. Mental status is at baseline.      UC Treatments / Results  Labs (all labs ordered are listed, but only abnormal results are displayed) Labs Reviewed  RPR  HIV ANTIBODY (ROUTINE TESTING W REFLEX)  CERVICOVAGINAL ANCILLARY ONLY    EKG   Radiology No results found.  Procedures Procedures (including critical care time)  Medications Ordered in UC Medications - No data to display  Initial Impression / Assessment and Plan / UC Course  I have reviewed the triage vital signs and the nursing notes.  Pertinent labs & imaging results that were available during my care of the patient were reviewed by me and considered in my medical decision making (see chart for details).  Vaginal discharge  Empirically treated for bacterial vaginosis due to history of reoccurrence, prescribed metronidazole  and advised abstaining from alcohol during treatment, Diflucan  sent prophylactically as she is prone to yeast infections after use of antibiotics,STI labs pending will treat per protocol, advised abstinence until lab results, and/or treatment is complete, advised condom use during all sexual encounters moving, may follow-up with urgent care as needed  Final Clinical Impressions(s) / UC Diagnoses   Final diagnoses:  Vaginal discharge     Discharge Instructions      Today you are being treated  for  Bacterial vaginosis   Take Metronidazole  500 mg twice a day for 7 days, do not drink alcohol while using medication, this will make you feel sick   Bacterial vaginosis which results from an overgrowth of one on several organisms that are normally present in your vagina. Vaginosis is an inflammation of the vagina that can  result in discharge, itching and pain.  Labs pending 2-3 days, you will be contacted if positive for any sti and treatment will be sent to the pharmacy, you will have to return to the clinic if positive for gonorrhea to receive treatment   Please refrain from having sex until labs results, if positive please refrain from  having sex until treatment complete and symptoms resolve   If positive for HIV, Syphilis, Chlamydia  gonorrhea or trichomoniasis please notify partner or partners so they may tested as well  Moving forward, it is recommended you use some form of protection against the transmission of sti infections  such as condoms or dental dams with each sexual encounter     In addition: Avoid baths, hot tubs and whirlpool spas.  Don't use scented or harsh soaps Avoid irritants. These include scented tampons and pads. Wipe from front to back after using the toilet. Don't douche. Your vagina doesn't require cleansing other than normal bathing.  Use a condom.  Wear cotton underwear, this fabric absorbs some moisture.        ED Prescriptions     Medication Sig Dispense Auth. Provider   metroNIDAZOLE  (FLAGYL ) 500 MG tablet Take 1 tablet (500 mg total) by mouth 2 (two) times daily. 14 tablet Ameka Krigbaum R, NP   fluconazole  (DIFLUCAN ) 150 MG tablet Take 1 tablet (150 mg total) by mouth once as needed for up to 1 dose. 1 tablet Teresa Shelba SAUNDERS, NP      PDMP not reviewed this encounter.   Teresa Shelba SAUNDERS, NP 09/13/23 1530

## 2023-09-13 NOTE — ED Triage Notes (Signed)
 Patient reports white milky discharge x 4 days. Patient reports that she would like full STD work up.

## 2023-09-13 NOTE — Discharge Instructions (Signed)
 Today you are being treated for  Bacterial vaginosis   Take Metronidazole  500 mg twice a day for 7 days, do not drink alcohol while using medication, this will make you feel sick   Bacterial vaginosis which results from an overgrowth of one on several organisms that are normally present in your vagina. Vaginosis is an inflammation of the vagina that can result in discharge, itching and pain.  Labs pending 2-3 days, you will be contacted if positive for any sti and treatment will be sent to the pharmacy, you will have to return to the clinic if positive for gonorrhea to receive treatment   Please refrain from having sex until labs results, if positive please refrain from having sex until treatment complete and symptoms resolve   If positive for HIV, Syphilis, Chlamydia  gonorrhea or trichomoniasis please notify partner or partners so they may tested as well  Moving forward, it is recommended you use some form of protection against the transmission of sti infections  such as condoms or dental dams with each sexual encounter     In addition: Avoid baths, hot tubs and whirlpool spas.  Don't use scented or harsh soaps Avoid irritants. These include scented tampons and pads. Wipe from front to back after using the toilet. Don't douche. Your vagina doesn't require cleansing other than normal bathing.  Use a condom.  Wear cotton underwear, this fabric absorbs some moisture.

## 2023-09-14 LAB — CERVICOVAGINAL ANCILLARY ONLY
Bacterial Vaginitis (gardnerella): NEGATIVE
Candida Glabrata: NEGATIVE
Candida Vaginitis: NEGATIVE
Chlamydia: NEGATIVE
Comment: NEGATIVE
Comment: NEGATIVE
Comment: NEGATIVE
Comment: NEGATIVE
Comment: NEGATIVE
Comment: NORMAL
Neisseria Gonorrhea: NEGATIVE
Trichomonas: NEGATIVE

## 2023-09-14 LAB — HIV ANTIBODY (ROUTINE TESTING W REFLEX): HIV Screen 4th Generation wRfx: NONREACTIVE

## 2023-09-14 LAB — RPR: RPR Ser Ql: NONREACTIVE

## 2023-10-16 ENCOUNTER — Encounter: Admitting: Student

## 2024-01-26 ENCOUNTER — Ambulatory Visit

## 2024-01-27 ENCOUNTER — Ambulatory Visit
Admission: RE | Admit: 2024-01-27 | Discharge: 2024-01-27 | Disposition: A | Discharge status: Home / Self Care | Source: Ambulatory Visit | Attending: Emergency Medicine | Admitting: Emergency Medicine

## 2024-01-27 VITALS — BP 126/79 | HR 90 | Temp 99.5°F | Resp 16

## 2024-01-27 DIAGNOSIS — N898 Other specified noninflammatory disorders of vagina: Secondary | ICD-10-CM | POA: Diagnosis not present

## 2024-01-27 DIAGNOSIS — R350 Frequency of micturition: Secondary | ICD-10-CM

## 2024-01-27 LAB — POCT URINE DIPSTICK
Bilirubin, UA: NEGATIVE
Glucose, UA: NEGATIVE mg/dL
Leukocytes, UA: NEGATIVE
Nitrite, UA: NEGATIVE
Spec Grav, UA: 1.025
Urobilinogen, UA: 1 U/dL
pH, UA: 6.5

## 2024-01-27 LAB — POCT URINE PREGNANCY: Preg Test, Ur: NEGATIVE

## 2024-01-27 MED ORDER — FLUCONAZOLE 150 MG PO TABS: 150.0000 mg | ORAL_TABLET | ORAL | 0 refills | Status: AC | PRN

## 2024-01-27 MED ORDER — METRONIDAZOLE 500 MG PO TABS: 500.0000 mg | ORAL_TABLET | Freq: Two times a day (BID) | ORAL | 0 refills | Status: AC

## 2024-01-27 NOTE — Discharge Instructions (Signed)
 Today you are being treated  for  Bacterial vaginosis   Urinalysis today does not show any signs of infection, urine pregnancy is negative, completed for medication purpose  Take Metronidazole  500 mg twice a day for 7 days, do not drink alcohol while using medication, this will make you feel sick, may use Diflucan  if vaginal itching recurs  Bacterial vaginosis which results from an overgrowth of one on several organisms that are normally present in your vagina. Vaginosis is an inflammation of the vagina that can result in discharge, itching and pain.  Labs pending 2-3 days, you will be contacted if positive for any sti and treatment will be sent to the pharmacy, you will have to return to the clinic if positive for gonorrhea to receive treatment   Please refrain from having sex until labs results, if positive please refrain from having sex until treatment complete and symptoms resolve   If positive for Chlamydia  gonorrhea or trichomoniasis please notify partner or partners so they may tested as well   it is recommended you use some form of protection against the transmission of sti infections  such as condoms or dental dams with each sexual encounter     In addition: Avoid baths, hot tubs and whirlpool spas.  Don't use scented or harsh soaps Avoid irritants. These include scented tampons and pads. Wipe from front to back after using the toilet. Don't douche. Your vagina doesn't require cleansing other than normal bathing.  Use a condom.  Wear cotton underwear, this fabric absorbs some moisture.

## 2024-01-27 NOTE — ED Triage Notes (Signed)
 Patient here today with c/o urinary frequency, vaginal odor and discharge X 1 week. Patient has a h/o BV.

## 2024-01-27 NOTE — ED Provider Notes (Signed)
 " CAY RALPH PELT    CSN: [id number] Arrival date & time: 01/27/24  0946      History   Chief Complaint Chief Complaint  Patient presents with   Urinary Frequency    Entered by patient    HPI FERGIE SHERBERT is a 29 y.o. female.   Patient presents for evaluation of urinary frequency, vaginal discharge odor and irritation present for 7 days.  Has been experiencing lower abdominal cramping but is due for menstruation and is unsure if related.  Denies urinary urgency, dysuria hematuria, flank pain.  No known exposure.   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      Home Medications    Prior to Admission medications  Medication Sig Start Date End Date Taking? Authorizing Provider  Sodium Chloride-Sodium Bicarb (NETI POT SINUS WASH NA) Place into the nose.    [provider]    Family History Family History  Problem Relation Age of Onset   Cancer Paternal Grandfather     Social History Social History[1]   Allergies   Amoxicillin  and Penicillins   Review of Systems Review of Systems   Physical Exam Triage Vital Signs ED Triage Vitals  Encounter Vitals Group     BP 01/27/24 0958 126/79     Girls Systolic BP Percentile --      Girls Diastolic BP Percentile --      Boys  Systolic BP Percentile --      Boys Diastolic BP Percentile --      Pulse Rate 01/27/24 0958 90     Resp 01/27/24 0958 16     Temp 01/27/24 0958 99.5 F (37.5 C)     Temp Source 01/27/24 0958 Oral     SpO2 01/27/24 0958 96 %     Weight --      Height --      Head Circumference --      Peak Flow --      Pain Score 01/27/24 0955 3     Pain Loc --      Pain Education --      Exclude from Growth Chart --    No data found.  Updated Vital Signs BP 126/79 (BP Location: Right Arm)   Pulse 90   Temp 99.5 F (37.5 C) (Oral)   Resp 16   LMP 12/29/2023 (Approximate)   SpO2 96%   Visual Acuity Right Eye Distance:   Left Eye Distance:   Bilateral Distance:    Right Eye Near:   Left Eye Near:    Bilateral Near:     Physical Exam Constitutional:      Appearance: Normal appearance.  Eyes:     Extraocular Movements: Extraocular movements intact.  Pulmonary:  Effort: Pulmonary effort is normal.  Abdominal:     Tenderness: There is no abdominal tenderness. There is no right CVA tenderness or left CVA tenderness.  Neurological:     Mental Status: She is alert and oriented to person, place, and time. Mental status is at baseline.      UC Treatments / Results  Labs (all labs ordered are listed, but only abnormal results are displayed) Labs Reviewed  POCT URINE DIPSTICK - Abnormal; Notable for the following components:      Result Value   Ketones, POC UA trace (5) (*)    Blood, UA trace-intact (*)    All other components within normal limits  POCT URINE PREGNANCY    EKG   Radiology No results found.  Procedures Procedures (including critical care time)  Medications Ordered in UC Medications - No data to display  Initial Impression / Assessment and Plan / UC Course  I have reviewed the triage vital signs and the nursing notes.  Pertinent labs & imaging results that were available during my care of the patient were reviewed by me and considered in my medical  decision making (see chart for details).  Vaginal discharge, urinary frequency  Urinalysis negative, last menstruation 12/29/2023, urine pregnancy negative, STI labs pending treating empirically for BV, metronidazole  prescribed and advised abstaining from alcohol and any form of intercourse during treatment, will treat further per protocol, recommended supportive care with follow-up as needed Final Clinical Impressions(s) / UC Diagnoses   Final diagnoses:  None   Discharge Instructions   None    ED Prescriptions   None    PDMP not reviewed this encounter.     [1]  Social History Tobacco Use   Smoking status: Never    Passive exposure: Never   Smokeless tobacco: Never  Vaping Use   Vaping status: Never Used  Substance Use Topics   Alcohol use: Yes    Comment: rarely   Drug use: Never     Teresa Shelba SAUNDERS, NP 01/27/24 1030  "

## 2024-01-29 ENCOUNTER — Ambulatory Visit (HOSPITAL_COMMUNITY): Payer: Self-pay

## 2024-01-29 LAB — CERVICOVAGINAL ANCILLARY ONLY
Bacterial Vaginitis (gardnerella): POSITIVE — AB
Candida Glabrata: NEGATIVE
Candida Vaginitis: NEGATIVE
Chlamydia: NEGATIVE
Comment: NEGATIVE
Comment: NEGATIVE
Comment: NEGATIVE
Comment: NEGATIVE
Comment: NEGATIVE
Comment: NORMAL
Neisseria Gonorrhea: NEGATIVE
Trichomonas: NEGATIVE
